# Patient Record
Sex: Female | Born: 1950 | Race: White | Hispanic: No | Marital: Married | Smoking: Never smoker
Health system: Southern US, Community
[De-identification: ages and names within clinical notes are randomized; demographics above are authoritative.]

---

## 2007-01-25 DIAGNOSIS — E7800 Pure hypercholesterolemia, unspecified: Secondary | ICD-10-CM | POA: Insufficient documentation

## 2008-09-07 DIAGNOSIS — H3530 Unspecified macular degeneration: Secondary | ICD-10-CM | POA: Insufficient documentation

## 2008-11-12 DIAGNOSIS — E559 Vitamin D deficiency, unspecified: Secondary | ICD-10-CM | POA: Insufficient documentation

## 2010-02-19 DIAGNOSIS — M8580 Other specified disorders of bone density and structure, unspecified site: Secondary | ICD-10-CM | POA: Insufficient documentation

## 2011-09-14 DIAGNOSIS — Z0000 Encounter for general adult medical examination without abnormal findings: Secondary | ICD-10-CM | POA: Insufficient documentation

## 2012-06-28 DIAGNOSIS — M778 Other enthesopathies, not elsewhere classified: Secondary | ICD-10-CM | POA: Insufficient documentation

## 2012-06-28 DIAGNOSIS — L859 Epidermal thickening, unspecified: Secondary | ICD-10-CM | POA: Insufficient documentation

## 2012-06-28 DIAGNOSIS — B351 Tinea unguium: Secondary | ICD-10-CM | POA: Insufficient documentation

## 2013-01-31 DIAGNOSIS — Z87442 Personal history of urinary calculi: Secondary | ICD-10-CM | POA: Insufficient documentation

## 2013-10-31 DIAGNOSIS — M79671 Pain in right foot: Secondary | ICD-10-CM | POA: Insufficient documentation

## 2013-12-26 DIAGNOSIS — IMO0002 Reserved for concepts with insufficient information to code with codable children: Secondary | ICD-10-CM | POA: Insufficient documentation

## 2015-06-07 ENCOUNTER — Encounter: Payer: Self-pay | Admitting: Podiatry

## 2015-06-07 ENCOUNTER — Ambulatory Visit (INDEPENDENT_AMBULATORY_CARE_PROVIDER_SITE_OTHER): Payer: Managed Care, Other (non HMO) | Admitting: Podiatry

## 2015-06-07 DIAGNOSIS — E119 Type 2 diabetes mellitus without complications: Secondary | ICD-10-CM | POA: Diagnosis not present

## 2015-06-07 DIAGNOSIS — M79676 Pain in unspecified toe(s): Secondary | ICD-10-CM | POA: Diagnosis not present

## 2015-06-07 DIAGNOSIS — B351 Tinea unguium: Secondary | ICD-10-CM | POA: Diagnosis not present

## 2015-06-07 NOTE — Progress Notes (Signed)
Patient ID: Isabel Weeks, female   DOB: 19-Dec-1950, 65 y.o.   MRN: [id number] Complaint:  Visit Type: Patient presents  to my office for continued preventative foot care services. Complaint: Patient states" my nails have grown long and thick and become painful to walk and wear shoes" Patient has been diagnosed with DM with no foot complications. The patient presents for preventative foot care services.   Podiatric Exam: Vascular: dorsalis pedis and posterior tibial pulses are palpable bilateral. Capillary return is immediate. Temperature gradient is WNL. Skin turgor WNL  Sensorium: Normal Semmes Weinstein monofilament test. Normal tactile sensation bilaterally. Nail Exam: Pt has thick disfigured discolored nails with subungual debris noted bilateral entire nail hallux  Ulcer Exam: There is no evidence of ulcer or pre-ulcerative changes or infection. Orthopedic Exam: Muscle tone and strength are WNL. No limitations in general ROM. No crepitus or effusions noted. Foot type and digits show no abnormalities. Bony prominences are unremarkable. 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 Skin: No Porokeratosis. No infection or ulcers  Diagnosis:  Onychomycosis, , Pain in right toe, pain in left toes  Treatment & Plan Procedures and Treatment: Consent by patient was obtained for treatment procedures. The patient understood the discussion of treatment and procedures well. All questions were answered thoroughly reviewed. Debridement of mycotic and hypertrophic toenails, 1 through 5 bilateral and clearing of subungual debris. No ulceration, no infection noted.  Return Visit-Office Procedure: Patient instructed to return to the office for a follow up visit 3 months for continued evaluation and treatment.    Gardiner Barefoot DPM

## 2015-09-06 ENCOUNTER — Encounter: Payer: Self-pay | Admitting: Podiatry

## 2015-09-06 ENCOUNTER — Ambulatory Visit (INDEPENDENT_AMBULATORY_CARE_PROVIDER_SITE_OTHER): Payer: Managed Care, Other (non HMO) | Admitting: Podiatry

## 2015-09-06 DIAGNOSIS — B351 Tinea unguium: Secondary | ICD-10-CM

## 2015-09-06 DIAGNOSIS — M79676 Pain in unspecified toe(s): Secondary | ICD-10-CM | POA: Diagnosis not present

## 2015-09-06 DIAGNOSIS — E119 Type 2 diabetes mellitus without complications: Secondary | ICD-10-CM | POA: Diagnosis not present

## 2015-09-06 NOTE — Progress Notes (Signed)
Patient ID: Isabel Weeks, female   DOB: 08/19/1950, 65 y.o.   MRN: [id number] Complaint:  Visit Type: Patient presents  to my office for continued preventative foot care services. Complaint: Patient states" my nails have grown long and thick and become painful to walk and wear shoes" Patient has been diagnosed with DM with no foot complications. The patient presents for preventative foot care services.   Podiatric Exam: Vascular: dorsalis pedis and posterior tibial pulses are palpable bilateral. Capillary return is immediate. Temperature gradient is WNL. Skin turgor WNL  Sensorium: Normal Semmes Weinstein monofilament test. Normal tactile sensation bilaterally. Nail Exam: Pt has thick disfigured discolored nails with subungual debris noted bilateral entire nail hallux  Ulcer Exam: There is no evidence of ulcer or pre-ulcerative changes or infection. Orthopedic Exam: Muscle tone and strength are WNL. No limitations in general ROM. No crepitus or effusions noted. Foot type and digits show no abnormalities. Bony prominences are unremarkable. 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 Skin: No Porokeratosis. No infection or ulcers  Diagnosis:  Onychomycosis, , Pain in right toe, pain in left toes  Treatment & Plan Procedures and Treatment: Consent by patient was obtained for treatment procedures. The patient understood the discussion of treatment and procedures well. All questions were answered thoroughly reviewed. Debridement of mycotic and hypertrophic toenails, 1 through 5 bilateral and clearing of subungual debris. No ulceration, no infection noted.  Return Visit-Office Procedure: Patient instructed to return to the office for a follow up visit 3 months for continued evaluation and treatment.    Karmon Andis DPM 

## 2015-09-13 ENCOUNTER — Encounter: Payer: Self-pay | Admitting: Podiatry

## 2015-09-13 ENCOUNTER — Ambulatory Visit (INDEPENDENT_AMBULATORY_CARE_PROVIDER_SITE_OTHER): Payer: Managed Care, Other (non HMO) | Admitting: Podiatry

## 2015-09-13 DIAGNOSIS — M79676 Pain in unspecified toe(s): Secondary | ICD-10-CM

## 2015-09-13 DIAGNOSIS — B351 Tinea unguium: Secondary | ICD-10-CM

## 2015-09-13 NOTE — Progress Notes (Signed)
Subjective:     Patient ID: Isabel Weeks, female   DOB: 10-25-1950, 65 y.o.   MRN: [id number]  HPI patient presents concerned because she had a small amount of tissue on the lateral side of the left hallux where the nail border was treated   Review of Systems     Objective:   Physical Exam Neurovascular status intact with a small amount of keratotic tissue on the distal aspect of the left hallux    Assessment:     Thickened tissue from previous ingrown toenail    Plan:     Debrided the tissue applied sterile dressing and instructed on Epson salt soaks. If any redness or drainage were to occur let us know immediately

## 2015-10-25 ENCOUNTER — Other Ambulatory Visit: Payer: Self-pay | Admitting: Family Medicine

## 2015-10-25 DIAGNOSIS — Z1231 Encounter for screening mammogram for malignant neoplasm of breast: Secondary | ICD-10-CM

## 2015-10-25 DIAGNOSIS — E2839 Other primary ovarian failure: Secondary | ICD-10-CM

## 2015-11-12 ENCOUNTER — Ambulatory Visit
Admission: RE | Admit: 2015-11-12 | Discharge: 2015-11-12 | Disposition: A | Discharge status: Home / Self Care | Payer: Managed Care, Other (non HMO) | Source: Ambulatory Visit | Attending: Family Medicine | Admitting: Family Medicine

## 2015-11-12 DIAGNOSIS — Z1231 Encounter for screening mammogram for malignant neoplasm of breast: Secondary | ICD-10-CM

## 2015-11-12 DIAGNOSIS — E2839 Other primary ovarian failure: Secondary | ICD-10-CM

## 2015-11-19 ENCOUNTER — Other Ambulatory Visit: Payer: Self-pay | Admitting: Family Medicine

## 2015-11-19 DIAGNOSIS — R928 Other abnormal and inconclusive findings on diagnostic imaging of breast: Secondary | ICD-10-CM

## 2015-11-27 ENCOUNTER — Ambulatory Visit
Admission: RE | Admit: 2015-11-27 | Discharge: 2015-11-27 | Disposition: A | Discharge status: Home / Self Care | Payer: Managed Care, Other (non HMO) | Source: Ambulatory Visit | Attending: Family Medicine | Admitting: Family Medicine

## 2015-11-27 DIAGNOSIS — R928 Other abnormal and inconclusive findings on diagnostic imaging of breast: Secondary | ICD-10-CM

## 2015-12-06 ENCOUNTER — Encounter: Payer: Self-pay | Admitting: Podiatry

## 2015-12-06 ENCOUNTER — Ambulatory Visit (INDEPENDENT_AMBULATORY_CARE_PROVIDER_SITE_OTHER): Payer: Managed Care, Other (non HMO) | Admitting: Podiatry

## 2015-12-06 DIAGNOSIS — B351 Tinea unguium: Secondary | ICD-10-CM | POA: Diagnosis not present

## 2015-12-06 DIAGNOSIS — M79676 Pain in unspecified toe(s): Secondary | ICD-10-CM | POA: Diagnosis not present

## 2015-12-06 DIAGNOSIS — E119 Type 2 diabetes mellitus without complications: Secondary | ICD-10-CM

## 2015-12-06 NOTE — Progress Notes (Signed)
Patient ID: Isabel Weeks, female   DOB: 11/05/1950, 65 y.o.   MRN: [id number] Complaint:  Visit Type: Patient presents  to my office for continued preventative foot care services. Complaint: Patient states" my nails have grown long and thick and become painful to walk and wear shoes" Patient has been diagnosed with DM with no foot complications. The patient presents for preventative foot care services.   Podiatric Exam: Vascular: dorsalis pedis and posterior tibial pulses are palpable bilateral. Capillary return is immediate. Temperature gradient is WNL. Skin turgor WNL  Sensorium: Normal Semmes Weinstein monofilament test. Normal tactile sensation bilaterally. Nail Exam: Pt has thick disfigured discolored nails with subungual debris noted bilateral entire nail hallux  Ulcer Exam: There is no evidence of ulcer or pre-ulcerative changes or infection. Orthopedic Exam: Muscle tone and strength are WNL. No limitations in general ROM. No crepitus or effusions noted. Foot type and digits show no abnormalities. Bony prominences are unremarkable. 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 Skin: No Porokeratosis. No infection or ulcers  Diagnosis:  Onychomycosis, , Pain in right toe, pain in left toes  Treatment & Plan Procedures and Treatment: Consent by patient was obtained for treatment procedures. The patient understood the discussion of treatment and procedures well. All questions were answered thoroughly reviewed. Debridement of mycotic and hypertrophic toenails, 1 through 5 bilateral and clearing of subungual debris. No ulceration, no infection noted.  Return Visit-Office Procedure: Patient instructed to return to the office for a follow up visit 3 months for continued evaluation and treatment.    Gardiner Barefoot DPM

## 2016-02-12 ENCOUNTER — Other Ambulatory Visit: Payer: Self-pay

## 2016-02-12 ENCOUNTER — Ambulatory Visit (HOSPITAL_COMMUNITY): Payer: Managed Care, Other (non HMO) | Attending: Cardiovascular Disease

## 2016-02-12 ENCOUNTER — Other Ambulatory Visit: Payer: Self-pay | Admitting: Family Medicine

## 2016-02-12 DIAGNOSIS — I5030 Unspecified diastolic (congestive) heart failure: Secondary | ICD-10-CM | POA: Insufficient documentation

## 2016-02-12 DIAGNOSIS — R011 Cardiac murmur, unspecified: Secondary | ICD-10-CM | POA: Insufficient documentation

## 2016-03-06 ENCOUNTER — Ambulatory Visit (INDEPENDENT_AMBULATORY_CARE_PROVIDER_SITE_OTHER): Payer: Managed Care, Other (non HMO) | Admitting: Podiatry

## 2016-03-06 ENCOUNTER — Encounter: Payer: Self-pay | Admitting: Podiatry

## 2016-03-06 VITALS — Ht <= 58 in | Wt 152.0 lb

## 2016-03-06 DIAGNOSIS — B351 Tinea unguium: Secondary | ICD-10-CM | POA: Diagnosis not present

## 2016-03-06 DIAGNOSIS — M79676 Pain in unspecified toe(s): Secondary | ICD-10-CM

## 2016-03-06 DIAGNOSIS — E119 Type 2 diabetes mellitus without complications: Secondary | ICD-10-CM

## 2016-03-06 NOTE — Progress Notes (Signed)
Patient ID: Isabel Weeks, female   DOB: 02-19-1951, 65 y.o.   MRN: [id number] Complaint:  Visit Type: Patient presents  to my office for continued preventative foot care services. Complaint: Patient states" my nails have grown long and thick and become painful to walk and wear shoes" Patient has been diagnosed with DM with no foot complications. The patient presents for preventative foot care services.   Podiatric Exam: Vascular: dorsalis pedis and posterior tibial pulses are palpable bilateral. Capillary return is immediate. Temperature gradient is WNL. Skin turgor WNL  Sensorium: Normal Semmes Weinstein monofilament test. Normal tactile sensation bilaterally. Nail Exam: Pt has thick disfigured discolored nails with subungual debris noted bilateral entire nail hallux  Ulcer Exam: There is no evidence of ulcer or pre-ulcerative changes or infection. Orthopedic Exam: Muscle tone and strength are WNL. No limitations in general ROM. No crepitus or effusions noted. Foot type and digits show no abnormalities. Bony prominences are unremarkable. 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 Skin: No Porokeratosis. No infection or ulcers  Diagnosis:  Onychomycosis, , Pain in right toe, pain in left toes  Treatment & Plan Procedures and Treatment: Consent by patient was obtained for treatment procedures. The patient understood the discussion of treatment and procedures well. All questions were answered thoroughly reviewed. Debridement of mycotic and hypertrophic toenails, 1 through 5 bilateral and clearing of subungual debris. No ulceration, no infection noted.  Return Visit-Office Procedure: Patient instructed to return to the office for a follow up visit 3 months for continued evaluation and treatment.    Gardiner Barefoot DPM

## 2016-05-06 ENCOUNTER — Other Ambulatory Visit: Payer: Self-pay | Admitting: Family Medicine

## 2016-05-06 DIAGNOSIS — R921 Mammographic calcification found on diagnostic imaging of breast: Secondary | ICD-10-CM

## 2016-05-19 ENCOUNTER — Ambulatory Visit
Admission: RE | Admit: 2016-05-19 | Discharge: 2016-05-19 | Disposition: A | Discharge status: Home / Self Care | Payer: Managed Care, Other (non HMO) | Source: Ambulatory Visit | Attending: Family Medicine | Admitting: Family Medicine

## 2016-05-19 DIAGNOSIS — R921 Mammographic calcification found on diagnostic imaging of breast: Secondary | ICD-10-CM

## 2016-06-05 ENCOUNTER — Ambulatory Visit: Payer: Managed Care, Other (non HMO) | Admitting: Podiatry

## 2016-06-12 ENCOUNTER — Ambulatory Visit (INDEPENDENT_AMBULATORY_CARE_PROVIDER_SITE_OTHER): Payer: Managed Care, Other (non HMO) | Admitting: Podiatry

## 2016-06-12 DIAGNOSIS — M79676 Pain in unspecified toe(s): Secondary | ICD-10-CM

## 2016-06-12 DIAGNOSIS — E119 Type 2 diabetes mellitus without complications: Secondary | ICD-10-CM

## 2016-06-12 DIAGNOSIS — B351 Tinea unguium: Secondary | ICD-10-CM | POA: Diagnosis not present

## 2016-06-12 NOTE — Progress Notes (Signed)
Patient ID: Isabel Weeks, female   DOB: 10/18/1950, 66 y.o.   MRN: [id number] Complaint:  Visit Type: Patient presents  to my office for continued preventative foot care services. Complaint: Patient states" my nails have grown long and thick and become painful to walk and wear shoes" Patient has been diagnosed with DM with no foot complications. The patient presents for preventative foot care services.   Podiatric Exam: Vascular: dorsalis pedis and posterior tibial pulses are palpable bilateral. Capillary return is immediate. Temperature gradient is WNL. Skin turgor WNL  Sensorium: Normal Semmes Weinstein monofilament test. Normal tactile sensation bilaterally. Nail Exam: Pt has thick disfigured discolored nails with subungual debris noted bilateral entire nail hallux  Ulcer Exam: There is no evidence of ulcer or pre-ulcerative changes or infection. Orthopedic Exam: Muscle tone and strength are WNL. No limitations in general ROM. No crepitus or effusions noted. Foot type and digits show no abnormalities. Bony prominences are unremarkable. 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 Skin: No Porokeratosis. No infection or ulcers  Diagnosis:  Onychomycosis, , Pain in right toe, pain in left toes  Treatment & Plan Procedures and Treatment: Consent by patient was obtained for treatment procedures. The patient understood the discussion of treatment and procedures well. All questions were answered thoroughly reviewed. Debridement of mycotic and hypertrophic toenails, 1 through 5 bilateral and clearing of subungual debris. No ulceration, no infection noted.  Return Visit-Office Procedure: Patient instructed to return to the office for a follow up visit 3 months for continued evaluation and treatment.    Gardiner Barefoot DPM

## 2016-09-11 ENCOUNTER — Encounter: Payer: Self-pay | Admitting: Podiatry

## 2016-09-11 ENCOUNTER — Ambulatory Visit (INDEPENDENT_AMBULATORY_CARE_PROVIDER_SITE_OTHER): Payer: 59 | Admitting: Podiatry

## 2016-09-11 DIAGNOSIS — M79676 Pain in unspecified toe(s): Secondary | ICD-10-CM

## 2016-09-11 DIAGNOSIS — E119 Type 2 diabetes mellitus without complications: Secondary | ICD-10-CM

## 2016-09-11 DIAGNOSIS — B351 Tinea unguium: Secondary | ICD-10-CM | POA: Diagnosis not present

## 2016-09-11 NOTE — Progress Notes (Signed)
Patient ID: Isabel Weeks, female   DOB: 1950/10/10, 66 y.o.   MRN: [id number] Complaint:  Visit Type: Patient presents  to my office for continued preventative foot care services. Complaint: Patient states" my nails have grown long and thick and become painful to walk and wear shoes" Patient has been diagnosed with DM with no foot complications. The patient presents for preventative foot care services.   Podiatric Exam: Vascular: dorsalis pedis and posterior tibial pulses are palpable bilateral. Capillary return is immediate. Temperature gradient is WNL. Skin turgor WNL  Sensorium: Normal Semmes Weinstein monofilament test. Normal tactile sensation bilaterally. Nail Exam: Pt has thick disfigured discolored nails with subungual debris noted bilateral entire nail hallux  Ulcer Exam: There is no evidence of ulcer or pre-ulcerative changes or infection. Orthopedic Exam: Muscle tone and strength are WNL. No limitations in general ROM. No crepitus or effusions noted. Foot type and digits show no abnormalities. Bony prominences are unremarkable. 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 Skin: No Porokeratosis. No infection or ulcers  Diagnosis:  Onychomycosis, , Pain in right toe, pain in left toes  Treatment & Plan Procedures and Treatment: Consent by patient was obtained for treatment procedures. The patient understood the discussion of treatment and procedures well. All questions were answered thoroughly reviewed. Debridement of mycotic and hypertrophic toenails, 1 through 5 bilateral and clearing of subungual debris. No ulceration, no infection noted.  Patient has pain that developed last week along the inside border right hallux. Return Visit-Office Procedure: Patient instructed to return to the office for a follow up visit 3 months for continued evaluation and treatment.    Gardiner Barefoot DPM

## 2016-12-02 ENCOUNTER — Ambulatory Visit (INDEPENDENT_AMBULATORY_CARE_PROVIDER_SITE_OTHER): Payer: 59 | Admitting: Podiatry

## 2016-12-02 DIAGNOSIS — B351 Tinea unguium: Secondary | ICD-10-CM | POA: Diagnosis not present

## 2016-12-02 DIAGNOSIS — M79676 Pain in unspecified toe(s): Secondary | ICD-10-CM

## 2016-12-02 DIAGNOSIS — E119 Type 2 diabetes mellitus without complications: Secondary | ICD-10-CM

## 2016-12-02 NOTE — Progress Notes (Signed)
Patient ID: Isabel Weeks, female   DOB: 12/24/1950, 66 y.o.   MRN: [id number] Complaint:  Visit Type: Patient presents  to my office for continued preventative foot care services. Complaint: Patient states" my nails have grown long and thick and become painful to walk and wear shoes" Patient has been diagnosed with DM with no foot complications. The patient presents for preventative foot care services.   Podiatric Exam: Vascular: dorsalis pedis and posterior tibial pulses are palpable bilateral. Capillary return is immediate. Temperature gradient is WNL. Skin turgor WNL  Sensorium: Normal Semmes Weinstein monofilament test. Normal tactile sensation bilaterally. Nail Exam: Pt has thick disfigured discolored nails with subungual debris noted bilateral entire nail hallux  Ulcer Exam: There is no evidence of ulcer or pre-ulcerative changes or infection. Orthopedic Exam: Muscle tone and strength are WNL. No limitations in general ROM. No crepitus or effusions noted. Foot type and digits show no abnormalities. Bony prominences are unremarkable. 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 Skin: No Porokeratosis. No infection or ulcers  Diagnosis:  Onychomycosis, , Pain in right toe, pain in left toes  Treatment & Plan Procedures and Treatment: Consent by patient was obtained for treatment procedures. The patient understood the discussion of treatment and procedures well. All questions were answered thoroughly reviewed. Debridement of mycotic and hypertrophic toenails, 1 through 5 bilateral and clearing of subungual debris. No ulceration, no infection noted.  Return Visit-Office Procedure: Patient instructed to return to the office for a follow up visit 3 months for continued evaluation and treatment.    Kelcey Korus DPM 

## 2016-12-04 ENCOUNTER — Ambulatory Visit: Payer: 59 | Admitting: Podiatry

## 2016-12-21 ENCOUNTER — Other Ambulatory Visit: Payer: Self-pay | Admitting: Family Medicine

## 2016-12-21 DIAGNOSIS — Z1231 Encounter for screening mammogram for malignant neoplasm of breast: Secondary | ICD-10-CM

## 2016-12-28 ENCOUNTER — Ambulatory Visit
Admission: RE | Admit: 2016-12-28 | Discharge: 2016-12-28 | Disposition: A | Discharge status: Home / Self Care | Payer: 59 | Source: Ambulatory Visit | Attending: Family Medicine | Admitting: Family Medicine

## 2016-12-28 DIAGNOSIS — Z1231 Encounter for screening mammogram for malignant neoplasm of breast: Secondary | ICD-10-CM

## 2017-03-10 ENCOUNTER — Encounter: Payer: Self-pay | Admitting: Podiatry

## 2017-03-10 ENCOUNTER — Ambulatory Visit (INDEPENDENT_AMBULATORY_CARE_PROVIDER_SITE_OTHER): Payer: 59 | Admitting: Podiatry

## 2017-03-10 DIAGNOSIS — E119 Type 2 diabetes mellitus without complications: Secondary | ICD-10-CM | POA: Diagnosis not present

## 2017-03-10 DIAGNOSIS — B351 Tinea unguium: Secondary | ICD-10-CM

## 2017-03-10 DIAGNOSIS — M79676 Pain in unspecified toe(s): Secondary | ICD-10-CM

## 2017-03-10 NOTE — Progress Notes (Signed)
Patient ID: Isabel Weeks, female   DOB: [birth year], 66 y.o.   MRN: [id number] Complaint:  Visit Type: Patient presents  to my office for continued preventative foot care services. Complaint: Patient states" my nails have grown long and thick and become painful to walk and wear shoes" Patient has been diagnosed with DM with no foot complications. The patient presents for preventative foot care services.   Podiatric Exam: Vascular: dorsalis pedis and posterior tibial pulses are palpable bilateral. Capillary return is immediate. Temperature gradient is WNL. Skin turgor WNL  Sensorium: Normal Semmes Weinstein monofilament test. Normal tactile sensation bilaterally. Nail Exam: Pt has thick disfigured discolored nails with subungual debris noted bilateral entire nail hallux  Ulcer Exam: There is no evidence of ulcer or pre-ulcerative changes or infection. Orthopedic Exam: Muscle tone and strength are WNL. No limitations in general ROM. No crepitus or effusions noted. Foot type and digits show no abnormalities. Bony prominences are unremarkable. 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 Skin: No Porokeratosis. No infection or ulcers  Diagnosis:  Onychomycosis, , Pain in right toe, pain in left toes  Treatment & Plan Procedures and Treatment: Consent by patient was obtained for treatment procedures. The patient understood the discussion of treatment and procedures well. All questions were answered thoroughly reviewed. Debridement of mycotic and hypertrophic toenails, 1 through 5 bilateral and clearing of subungual debris. No ulceration, no infection noted.  Return Visit-Office Procedure: Patient instructed to return to the office for a follow up visit 3 months for continued evaluation and treatment.    Gardiner Barefoot DPM

## 2017-06-16 ENCOUNTER — Ambulatory Visit (INDEPENDENT_AMBULATORY_CARE_PROVIDER_SITE_OTHER): Payer: 59 | Admitting: Podiatry

## 2017-06-16 ENCOUNTER — Encounter: Payer: Self-pay | Admitting: Podiatry

## 2017-06-16 DIAGNOSIS — M79676 Pain in unspecified toe(s): Secondary | ICD-10-CM

## 2017-06-16 DIAGNOSIS — E119 Type 2 diabetes mellitus without complications: Secondary | ICD-10-CM

## 2017-06-16 DIAGNOSIS — B351 Tinea unguium: Secondary | ICD-10-CM

## 2017-06-16 NOTE — Progress Notes (Signed)
Patient ID: Isabel Weeks, female   DOB: 1950/06/06, 67 y.o.   MRN: [id number] Complaint:  Visit Type: Patient presents  to my office for continued preventative foot care services. Complaint: Patient states" my nails have grown long and thick and become painful to walk and wear shoes" Patient has been diagnosed with DM with no foot complications. The patient presents for preventative foot care services. Patient says she is starting to take lisinopril.  Podiatric Exam: Vascular: dorsalis pedis and posterior tibial pulses are palpable bilateral. Capillary return is immediate. Temperature gradient is WNL. Skin turgor WNL  Sensorium: Normal Semmes Weinstein monofilament test. Normal tactile sensation bilaterally. Nail Exam: Pt has thick disfigured discolored nails with subungual debris noted bilateral entire nail hallux  Ulcer Exam: There is no evidence of ulcer or pre-ulcerative changes or infection. Orthopedic Exam: Muscle tone and strength are WNL. No limitations in general ROM. No crepitus or effusions noted. Foot type and digits show no abnormalities. Bony prominences are unremarkable. 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 Skin: No Porokeratosis. No infection or ulcers  Diagnosis:  Onychomycosis, , Pain in right toe, pain in left toes  Treatment & Plan Procedures and Treatment: Consent by patient was obtained for treatment procedures. The patient understood the discussion of treatment and procedures well. All questions were answered thoroughly reviewed. Debridement of mycotic and hypertrophic toenails, 1 through 5 bilateral and clearing of subungual debris. No ulceration, no infection noted.  Return Visit-Office Procedure: Patient instructed to return to the office for a follow up visit 3 months for continued evaluation and treatment.    Gardiner Barefoot DPM

## 2017-07-13 ENCOUNTER — Other Ambulatory Visit: Payer: Self-pay | Admitting: Family Medicine

## 2017-07-13 DIAGNOSIS — Q159 Congenital malformation of eye, unspecified: Secondary | ICD-10-CM

## 2017-08-02 ENCOUNTER — Ambulatory Visit
Admission: RE | Admit: 2017-08-02 | Discharge: 2017-08-02 | Disposition: A | Discharge status: Home / Self Care | Payer: 59 | Source: Ambulatory Visit | Attending: Family Medicine | Admitting: Family Medicine

## 2017-08-02 DIAGNOSIS — Q159 Congenital malformation of eye, unspecified: Secondary | ICD-10-CM

## 2017-09-15 ENCOUNTER — Encounter: Payer: Self-pay | Admitting: Podiatry

## 2017-09-15 ENCOUNTER — Ambulatory Visit (INDEPENDENT_AMBULATORY_CARE_PROVIDER_SITE_OTHER): Payer: 59 | Admitting: Podiatry

## 2017-09-15 DIAGNOSIS — B351 Tinea unguium: Secondary | ICD-10-CM | POA: Diagnosis not present

## 2017-09-15 DIAGNOSIS — M79676 Pain in unspecified toe(s): Secondary | ICD-10-CM | POA: Diagnosis not present

## 2017-09-15 DIAGNOSIS — E119 Type 2 diabetes mellitus without complications: Secondary | ICD-10-CM

## 2017-09-15 NOTE — Progress Notes (Signed)
Patient ID: Isabel Weeks, female   DOB: 03/02/1951, 67 y.o.   MRN: [id number] Complaint:  Visit Type: Patient presents  to my office for continued preventative foot care services. Complaint: Patient states" my nails have grown long and thick and become painful to walk and wear shoes" Patient has been diagnosed with DM with no foot complications. The patient presents for preventative foot care services. Patient says she is starting to take lisinopril.  Podiatric Exam: Vascular: dorsalis pedis and posterior tibial pulses are palpable bilateral. Capillary return is immediate. Temperature gradient is WNL. Skin turgor WNL  Sensorium: Normal Semmes Weinstein monofilament test. Normal tactile sensation bilaterally. Nail Exam: Pt has thick disfigured discolored nails with subungual debris noted bilateral entire nail hallux  Ulcer Exam: There is no evidence of ulcer or pre-ulcerative changes or infection. Orthopedic Exam: Muscle tone and strength are WNL. No limitations in general ROM. No crepitus or effusions noted. Foot type and digits show no abnormalities. Bony prominences are unremarkable. 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 Skin: No Porokeratosis. No infection or ulcers  Diagnosis:  Onychomycosis, , Pain in right toe, pain in left toes  Treatment & Plan Procedures and Treatment: Consent by patient was obtained for treatment procedures. The patient understood the discussion of treatment and procedures well. All questions were answered thoroughly reviewed. Debridement of mycotic and hypertrophic toenails, 1 through 5 bilateral and clearing of subungual debris. No ulceration, no infection noted.  Return Visit-Office Procedure: Patient instructed to return to the office for a follow up visit 3 months for continued evaluation and treatment.    Shawnn Bouillon DPM 

## 2017-11-09 DIAGNOSIS — E1169 Type 2 diabetes mellitus with other specified complication: Secondary | ICD-10-CM | POA: Diagnosis not present

## 2017-11-09 DIAGNOSIS — Z0000 Encounter for general adult medical examination without abnormal findings: Secondary | ICD-10-CM | POA: Diagnosis not present

## 2017-11-09 DIAGNOSIS — E785 Hyperlipidemia, unspecified: Secondary | ICD-10-CM | POA: Diagnosis not present

## 2017-11-30 DIAGNOSIS — H35423 Microcystoid degeneration of retina, bilateral: Secondary | ICD-10-CM | POA: Diagnosis not present

## 2017-11-30 DIAGNOSIS — E113291 Type 2 diabetes mellitus with mild nonproliferative diabetic retinopathy without macular edema, right eye: Secondary | ICD-10-CM | POA: Diagnosis not present

## 2017-11-30 DIAGNOSIS — H43813 Vitreous degeneration, bilateral: Secondary | ICD-10-CM | POA: Diagnosis not present

## 2017-11-30 DIAGNOSIS — H353134 Nonexudative age-related macular degeneration, bilateral, advanced atrophic with subfoveal involvement: Secondary | ICD-10-CM | POA: Diagnosis not present

## 2017-12-01 DIAGNOSIS — I10 Essential (primary) hypertension: Secondary | ICD-10-CM | POA: Diagnosis not present

## 2017-12-07 DIAGNOSIS — E113293 Type 2 diabetes mellitus with mild nonproliferative diabetic retinopathy without macular edema, bilateral: Secondary | ICD-10-CM | POA: Diagnosis not present

## 2017-12-07 DIAGNOSIS — Z961 Presence of intraocular lens: Secondary | ICD-10-CM | POA: Diagnosis not present

## 2017-12-07 DIAGNOSIS — H2512 Age-related nuclear cataract, left eye: Secondary | ICD-10-CM | POA: Diagnosis not present

## 2017-12-07 DIAGNOSIS — H353134 Nonexudative age-related macular degeneration, bilateral, advanced atrophic with subfoveal involvement: Secondary | ICD-10-CM | POA: Diagnosis not present

## 2017-12-07 DIAGNOSIS — H26491 Other secondary cataract, right eye: Secondary | ICD-10-CM | POA: Diagnosis not present

## 2017-12-15 ENCOUNTER — Encounter: Payer: Self-pay | Admitting: Podiatry

## 2017-12-15 ENCOUNTER — Encounter: Payer: Self-pay | Admitting: *Deleted

## 2017-12-15 ENCOUNTER — Ambulatory Visit: Payer: Medicare HMO | Admitting: Podiatry

## 2017-12-15 DIAGNOSIS — M79676 Pain in unspecified toe(s): Secondary | ICD-10-CM

## 2017-12-15 DIAGNOSIS — B351 Tinea unguium: Secondary | ICD-10-CM | POA: Diagnosis not present

## 2017-12-15 DIAGNOSIS — E119 Type 2 diabetes mellitus without complications: Secondary | ICD-10-CM

## 2017-12-15 NOTE — Progress Notes (Signed)
Patient ID: Isabel Weeks, female   DOB: 12/31/1950, 67 y.o.   MRN: [id number] Complaint:  Visit Type: Patient presents  to my office for continued preventative foot care services. Complaint: Patient states" my nails have grown long and thick and become painful to walk and wear shoes" Patient has been diagnosed with DM with no foot complications. The patient presents for preventative foot care services. Patient says she is starting to take lisinopril.  Podiatric Exam: Vascular: dorsalis pedis and posterior tibial pulses are palpable bilateral. Capillary return is immediate. Temperature gradient is WNL. Skin turgor WNL  Sensorium: Normal Semmes Weinstein monofilament test. Normal tactile sensation bilaterally. Nail Exam: Pt has thick disfigured discolored nails with subungual debris noted bilateral entire nail hallux  Ulcer Exam: There is no evidence of ulcer or pre-ulcerative changes or infection. Orthopedic Exam: Muscle tone and strength are WNL. No limitations in general ROM. No crepitus or effusions noted. Foot type and digits show no abnormalities. Bony prominences are unremarkable. 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 Skin: No Porokeratosis. No infection or ulcers  Diagnosis:  Onychomycosis, , Pain in right toe, pain in left toes  Treatment & Plan Procedures and Treatment: Consent by patient was obtained for treatment procedures. The patient understood the discussion of treatment and procedures well. All questions were answered thoroughly reviewed. Debridement of mycotic and hypertrophic toenails, 1 through 5 bilateral and clearing of subungual debris. No ulceration, no infection noted.  Return Visit-Office Procedure: Patient instructed to return to the office for a follow up visit 3 months for continued evaluation and treatment.    Viraaj Vorndran DPM 

## 2017-12-28 ENCOUNTER — Other Ambulatory Visit: Payer: Self-pay | Admitting: Family Medicine

## 2017-12-28 DIAGNOSIS — Z1231 Encounter for screening mammogram for malignant neoplasm of breast: Secondary | ICD-10-CM

## 2017-12-30 DIAGNOSIS — D225 Melanocytic nevi of trunk: Secondary | ICD-10-CM | POA: Diagnosis not present

## 2017-12-30 DIAGNOSIS — D2262 Melanocytic nevi of left upper limb, including shoulder: Secondary | ICD-10-CM | POA: Diagnosis not present

## 2017-12-30 DIAGNOSIS — D2239 Melanocytic nevi of other parts of face: Secondary | ICD-10-CM | POA: Diagnosis not present

## 2017-12-30 DIAGNOSIS — C44612 Basal cell carcinoma of skin of right upper limb, including shoulder: Secondary | ICD-10-CM | POA: Diagnosis not present

## 2017-12-30 DIAGNOSIS — D2271 Melanocytic nevi of right lower limb, including hip: Secondary | ICD-10-CM | POA: Diagnosis not present

## 2017-12-30 DIAGNOSIS — L821 Other seborrheic keratosis: Secondary | ICD-10-CM | POA: Diagnosis not present

## 2017-12-30 DIAGNOSIS — D2272 Melanocytic nevi of left lower limb, including hip: Secondary | ICD-10-CM | POA: Diagnosis not present

## 2017-12-30 DIAGNOSIS — L570 Actinic keratosis: Secondary | ICD-10-CM | POA: Diagnosis not present

## 2018-01-19 DIAGNOSIS — Z91038 Other insect allergy status: Secondary | ICD-10-CM | POA: Diagnosis not present

## 2018-01-19 DIAGNOSIS — Z7984 Long term (current) use of oral hypoglycemic drugs: Secondary | ICD-10-CM | POA: Diagnosis not present

## 2018-01-19 DIAGNOSIS — E785 Hyperlipidemia, unspecified: Secondary | ICD-10-CM | POA: Diagnosis not present

## 2018-01-19 DIAGNOSIS — E1165 Type 2 diabetes mellitus with hyperglycemia: Secondary | ICD-10-CM | POA: Diagnosis not present

## 2018-01-19 DIAGNOSIS — E669 Obesity, unspecified: Secondary | ICD-10-CM | POA: Diagnosis not present

## 2018-01-19 DIAGNOSIS — Z823 Family history of stroke: Secondary | ICD-10-CM | POA: Diagnosis not present

## 2018-01-19 DIAGNOSIS — I10 Essential (primary) hypertension: Secondary | ICD-10-CM | POA: Diagnosis not present

## 2018-01-19 DIAGNOSIS — G3184 Mild cognitive impairment, so stated: Secondary | ICD-10-CM | POA: Diagnosis not present

## 2018-01-19 DIAGNOSIS — Z882 Allergy status to sulfonamides status: Secondary | ICD-10-CM | POA: Diagnosis not present

## 2018-01-19 DIAGNOSIS — Z8249 Family history of ischemic heart disease and other diseases of the circulatory system: Secondary | ICD-10-CM | POA: Diagnosis not present

## 2018-01-20 ENCOUNTER — Ambulatory Visit
Admission: RE | Admit: 2018-01-20 | Discharge: 2018-01-20 | Disposition: A | Discharge status: Home / Self Care | Payer: Medicare HMO | Source: Ambulatory Visit | Attending: Family Medicine | Admitting: Family Medicine

## 2018-01-20 DIAGNOSIS — Z1231 Encounter for screening mammogram for malignant neoplasm of breast: Secondary | ICD-10-CM

## 2018-01-26 DIAGNOSIS — L570 Actinic keratosis: Secondary | ICD-10-CM | POA: Diagnosis not present

## 2018-01-26 DIAGNOSIS — C44612 Basal cell carcinoma of skin of right upper limb, including shoulder: Secondary | ICD-10-CM | POA: Diagnosis not present

## 2018-02-09 DIAGNOSIS — H3530 Unspecified macular degeneration: Secondary | ICD-10-CM | POA: Diagnosis not present

## 2018-02-09 DIAGNOSIS — E785 Hyperlipidemia, unspecified: Secondary | ICD-10-CM | POA: Diagnosis not present

## 2018-02-09 DIAGNOSIS — I10 Essential (primary) hypertension: Secondary | ICD-10-CM | POA: Diagnosis not present

## 2018-02-09 DIAGNOSIS — Z23 Encounter for immunization: Secondary | ICD-10-CM | POA: Diagnosis not present

## 2018-02-09 DIAGNOSIS — E119 Type 2 diabetes mellitus without complications: Secondary | ICD-10-CM | POA: Diagnosis not present

## 2018-03-16 ENCOUNTER — Encounter: Payer: Self-pay | Admitting: Podiatry

## 2018-03-16 ENCOUNTER — Ambulatory Visit: Payer: Medicare HMO | Admitting: Podiatry

## 2018-03-16 DIAGNOSIS — E119 Type 2 diabetes mellitus without complications: Secondary | ICD-10-CM

## 2018-03-16 DIAGNOSIS — B351 Tinea unguium: Secondary | ICD-10-CM | POA: Diagnosis not present

## 2018-03-16 DIAGNOSIS — M79676 Pain in unspecified toe(s): Secondary | ICD-10-CM | POA: Diagnosis not present

## 2018-03-16 NOTE — Progress Notes (Signed)
Patient ID: Isabel Weeks, female   DOB: 09-03-1950, 67 y.o.   MRN: [id number] Complaint:  Visit Type: Patient presents  to my office for continued preventative foot care services. Complaint: Patient states" my nails have grown long and thick and become painful to walk and wear shoes" Patient has been diagnosed with DM with no foot complications. The patient presents for preventative foot care services. Patient says she is starting to take lisinopril.  Podiatric Exam: Vascular: dorsalis pedis and posterior tibial pulses are palpable bilateral. Capillary return is immediate. Temperature gradient is WNL. Skin turgor WNL  Sensorium: Normal Semmes Weinstein monofilament test. Normal tactile sensation bilaterally. Nail Exam: Pt has thick disfigured discolored nails with subungual debris noted bilateral entire nail hallux  Ulcer Exam: There is no evidence of ulcer or pre-ulcerative changes or infection. Orthopedic Exam: Muscle tone and strength are WNL. No limitations in general ROM. No crepitus or effusions noted. Foot type and digits show no abnormalities. Bony prominences are unremarkable. 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 Skin: No Porokeratosis. No infection or ulcers  Diagnosis:  Onychomycosis, , Pain in right toe, pain in left toes  Treatment & Plan Procedures and Treatment: Consent by patient was obtained for treatment procedures. The patient understood the discussion of treatment and procedures well. All questions were answered thoroughly reviewed. Debridement of mycotic and hypertrophic toenails, 1 through 5 bilateral and clearing of subungual debris. No ulceration, no infection noted.  Return Visit-Office Procedure: Patient instructed to return to the office for a follow up visit 3 months for continued evaluation and treatment.    Gardiner Barefoot DPM

## 2018-05-10 DIAGNOSIS — E119 Type 2 diabetes mellitus without complications: Secondary | ICD-10-CM | POA: Diagnosis not present

## 2018-05-10 DIAGNOSIS — E785 Hyperlipidemia, unspecified: Secondary | ICD-10-CM | POA: Diagnosis not present

## 2018-05-10 DIAGNOSIS — Z23 Encounter for immunization: Secondary | ICD-10-CM | POA: Diagnosis not present

## 2018-05-10 DIAGNOSIS — I10 Essential (primary) hypertension: Secondary | ICD-10-CM | POA: Diagnosis not present

## 2018-05-10 DIAGNOSIS — H3530 Unspecified macular degeneration: Secondary | ICD-10-CM | POA: Diagnosis not present

## 2018-05-31 DIAGNOSIS — R69 Illness, unspecified: Secondary | ICD-10-CM | POA: Diagnosis not present

## 2018-06-01 DIAGNOSIS — H353134 Nonexudative age-related macular degeneration, bilateral, advanced atrophic with subfoveal involvement: Secondary | ICD-10-CM | POA: Diagnosis not present

## 2018-06-01 DIAGNOSIS — H35423 Microcystoid degeneration of retina, bilateral: Secondary | ICD-10-CM | POA: Diagnosis not present

## 2018-06-01 DIAGNOSIS — H35372 Puckering of macula, left eye: Secondary | ICD-10-CM | POA: Diagnosis not present

## 2018-06-01 DIAGNOSIS — E113291 Type 2 diabetes mellitus with mild nonproliferative diabetic retinopathy without macular edema, right eye: Secondary | ICD-10-CM | POA: Diagnosis not present

## 2018-06-07 DIAGNOSIS — R69 Illness, unspecified: Secondary | ICD-10-CM | POA: Diagnosis not present

## 2018-06-15 ENCOUNTER — Ambulatory Visit: Payer: Medicare HMO | Admitting: Podiatry

## 2018-06-15 ENCOUNTER — Encounter: Payer: Self-pay | Admitting: Podiatry

## 2018-06-15 DIAGNOSIS — B351 Tinea unguium: Secondary | ICD-10-CM | POA: Diagnosis not present

## 2018-06-15 DIAGNOSIS — E119 Type 2 diabetes mellitus without complications: Secondary | ICD-10-CM

## 2018-06-15 DIAGNOSIS — M79676 Pain in unspecified toe(s): Secondary | ICD-10-CM

## 2018-06-15 NOTE — Progress Notes (Signed)
Patient ID: Isabel Weeks, female   DOB: 30-Nov-1950, 68 y.o.   MRN: [id number] Complaint:  Visit Type: Patient presents  to my office for continued preventative foot care services. Complaint: Patient states" my nails have grown long and thick and become painful to walk and wear shoes" Patient has been diagnosed with DM with no foot complications. The patient presents for preventative foot care services. Patient says she is starting to take lisinopril.  Podiatric Exam: Vascular: dorsalis pedis and posterior tibial pulses are palpable bilateral. Capillary return is immediate. Temperature gradient is WNL. Skin turgor WNL  Sensorium: Normal Semmes Weinstein monofilament test. Normal tactile sensation bilaterally. Nail Exam: Pt has thick disfigured discolored nails with subungual debris noted bilateral entire nail hallux  Ulcer Exam: There is no evidence of ulcer or pre-ulcerative changes or infection. Orthopedic Exam: Muscle tone and strength are WNL. No limitations in general ROM. No crepitus or effusions noted. Foot type and digits show no abnormalities. Bony prominences are unremarkable. 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 Skin: No Porokeratosis. No infection or ulcers  Diagnosis:  Onychomycosis, , Pain in right toe, pain in left toes  Treatment & Plan Procedures and Treatment: Consent by patient was obtained for treatment procedures. The patient understood the discussion of treatment and procedures well. All questions were answered thoroughly reviewed. Debridement of mycotic and hypertrophic toenails, 1 through 5 bilateral and clearing of subungual debris. No ulceration, no infection noted.  Return Visit-Office Procedure: Patient instructed to return to the office for a follow up visit 3 months for continued evaluation and treatment.    Gardiner Barefoot DPM

## 2018-08-10 DIAGNOSIS — E1169 Type 2 diabetes mellitus with other specified complication: Secondary | ICD-10-CM | POA: Diagnosis not present

## 2018-08-10 DIAGNOSIS — I10 Essential (primary) hypertension: Secondary | ICD-10-CM | POA: Diagnosis not present

## 2018-08-10 DIAGNOSIS — H3530 Unspecified macular degeneration: Secondary | ICD-10-CM | POA: Diagnosis not present

## 2018-08-10 DIAGNOSIS — E785 Hyperlipidemia, unspecified: Secondary | ICD-10-CM | POA: Diagnosis not present

## 2018-08-10 DIAGNOSIS — E119 Type 2 diabetes mellitus without complications: Secondary | ICD-10-CM | POA: Diagnosis not present

## 2018-09-14 ENCOUNTER — Encounter: Payer: Self-pay | Admitting: Podiatry

## 2018-09-14 ENCOUNTER — Other Ambulatory Visit: Payer: Self-pay

## 2018-09-14 ENCOUNTER — Ambulatory Visit: Payer: Medicare HMO | Admitting: Podiatry

## 2018-09-14 DIAGNOSIS — E119 Type 2 diabetes mellitus without complications: Secondary | ICD-10-CM

## 2018-09-14 DIAGNOSIS — M79676 Pain in unspecified toe(s): Secondary | ICD-10-CM | POA: Diagnosis not present

## 2018-09-14 DIAGNOSIS — B351 Tinea unguium: Secondary | ICD-10-CM | POA: Diagnosis not present

## 2018-09-14 NOTE — Progress Notes (Signed)
Patient ID: Isabel Weeks, female   DOB: 1950/10/16, 68 y.o.   MRN: [id number] Complaint:  Visit Type: Patient presents  to my office for continued preventative foot care services. Complaint: Patient states" my nails have grown long and thick and become painful to walk and wear shoes" Patient has been diagnosed with DM with no foot complications. The patient presents for preventative foot care services. Patient says she is starting to take lisinopril.  Podiatric Exam: Vascular: dorsalis pedis and posterior tibial pulses are palpable bilateral. Capillary return is immediate. Temperature gradient is WNL. Skin turgor WNL  Sensorium: Normal Semmes Weinstein monofilament test. Normal tactile sensation bilaterally. Nail Exam: Pt has thick disfigured discolored nails with subungual debris noted bilateral entire nail hallux  Ulcer Exam: There is no evidence of ulcer or pre-ulcerative changes or infection. Orthopedic Exam: Muscle tone and strength are WNL. No limitations in general ROM. No crepitus or effusions noted. Foot type and digits show no abnormalities. Bony prominences are unremarkable. 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 Skin: No Porokeratosis. No infection or ulcers  Diagnosis:  Onychomycosis, , Pain in right toe, pain in left toes  Treatment & Plan Procedures and Treatment: Consent by patient was obtained for treatment procedures. The patient understood the discussion of treatment and procedures well. All questions were answered thoroughly reviewed. Debridement of mycotic and hypertrophic toenails, 1 through 5 bilateral and clearing of subungual debris. No ulceration, no infection noted.  Return Visit-Office Procedure: Patient instructed to return to the office for a follow up visit 3 months for continued evaluation and treatment.    Gardiner Barefoot DPM

## 2018-10-17 DIAGNOSIS — H353120 Nonexudative age-related macular degeneration, left eye, stage unspecified: Secondary | ICD-10-CM | POA: Diagnosis not present

## 2018-10-17 DIAGNOSIS — H2512 Age-related nuclear cataract, left eye: Secondary | ICD-10-CM | POA: Diagnosis not present

## 2018-11-10 DIAGNOSIS — H2512 Age-related nuclear cataract, left eye: Secondary | ICD-10-CM | POA: Diagnosis not present

## 2018-11-23 DIAGNOSIS — E113293 Type 2 diabetes mellitus with mild nonproliferative diabetic retinopathy without macular edema, bilateral: Secondary | ICD-10-CM | POA: Diagnosis not present

## 2018-11-23 DIAGNOSIS — Z1211 Encounter for screening for malignant neoplasm of colon: Secondary | ICD-10-CM | POA: Diagnosis not present

## 2018-11-23 DIAGNOSIS — Z7984 Long term (current) use of oral hypoglycemic drugs: Secondary | ICD-10-CM | POA: Diagnosis not present

## 2018-11-23 DIAGNOSIS — I10 Essential (primary) hypertension: Secondary | ICD-10-CM | POA: Diagnosis not present

## 2018-11-23 DIAGNOSIS — E1169 Type 2 diabetes mellitus with other specified complication: Secondary | ICD-10-CM | POA: Diagnosis not present

## 2018-11-23 DIAGNOSIS — E785 Hyperlipidemia, unspecified: Secondary | ICD-10-CM | POA: Diagnosis not present

## 2018-11-23 DIAGNOSIS — Z0000 Encounter for general adult medical examination without abnormal findings: Secondary | ICD-10-CM | POA: Diagnosis not present

## 2018-12-09 DIAGNOSIS — H353120 Nonexudative age-related macular degeneration, left eye, stage unspecified: Secondary | ICD-10-CM | POA: Diagnosis not present

## 2018-12-20 DIAGNOSIS — R69 Illness, unspecified: Secondary | ICD-10-CM | POA: Diagnosis not present

## 2018-12-21 ENCOUNTER — Ambulatory Visit: Payer: Medicare HMO | Admitting: Podiatry

## 2018-12-21 ENCOUNTER — Other Ambulatory Visit: Payer: Self-pay

## 2018-12-21 ENCOUNTER — Encounter: Payer: Self-pay | Admitting: Podiatry

## 2018-12-21 VITALS — Temp 97.7°F

## 2018-12-21 DIAGNOSIS — M79676 Pain in unspecified toe(s): Secondary | ICD-10-CM

## 2018-12-21 DIAGNOSIS — B351 Tinea unguium: Secondary | ICD-10-CM | POA: Diagnosis not present

## 2018-12-21 DIAGNOSIS — E119 Type 2 diabetes mellitus without complications: Secondary | ICD-10-CM | POA: Diagnosis not present

## 2018-12-21 NOTE — Progress Notes (Signed)
Patient ID: Isabel Weeks, female   DOB: Feb 08, 1951, 68 y.o.   MRN: [id number] Complaint:  Visit Type: Patient presents  to my office for continued preventative foot care services. Complaint: Patient states" my nails have grown long and thick and become painful to walk and wear shoes" Patient has been diagnosed with DM with no foot complications. The patient presents for preventative foot care services. Patient says she is starting to take lisinopril.  Podiatric Exam: Vascular: dorsalis pedis and posterior tibial pulses are palpable bilateral. Capillary return is immediate. Temperature gradient is WNL. Skin turgor WNL  Sensorium: Normal Semmes Weinstein monofilament test. Normal tactile sensation bilaterally. Nail Exam: Pt has thick disfigured discolored nails with subungual debris noted bilateral entire nail hallux  Ulcer Exam: There is no evidence of ulcer or pre-ulcerative changes or infection. Orthopedic Exam: Muscle tone and strength are WNL. No limitations in general ROM. No crepitus or effusions noted. Foot type and digits show no abnormalities. Bony prominences are unremarkable. 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 Skin: No Porokeratosis. No infection or ulcers  Diagnosis:  Onychomycosis, , Pain in right toe, pain in left toes  Treatment & Plan Procedures and Treatment: Consent by patient was obtained for treatment procedures. The patient understood the discussion of treatment and procedures well. All questions were answered thoroughly reviewed. Debridement of mycotic and hypertrophic toenails, 1 through 5 bilateral and clearing of subungual debris. No ulceration, no infection noted.  Return Visit-Office Procedure: Patient instructed to return to the office for a follow up visit 3 months for continued evaluation and treatment.    Gardiner Barefoot DPM

## 2018-12-26 DIAGNOSIS — E119 Type 2 diabetes mellitus without complications: Secondary | ICD-10-CM | POA: Diagnosis not present

## 2018-12-26 DIAGNOSIS — Z7984 Long term (current) use of oral hypoglycemic drugs: Secondary | ICD-10-CM | POA: Diagnosis not present

## 2018-12-26 DIAGNOSIS — Z86010 Personal history of colonic polyps: Secondary | ICD-10-CM | POA: Diagnosis not present

## 2019-02-01 ENCOUNTER — Other Ambulatory Visit: Payer: Self-pay | Admitting: Family Medicine

## 2019-02-01 DIAGNOSIS — Z1231 Encounter for screening mammogram for malignant neoplasm of breast: Secondary | ICD-10-CM

## 2019-02-06 DIAGNOSIS — Z1159 Encounter for screening for other viral diseases: Secondary | ICD-10-CM | POA: Diagnosis not present

## 2019-02-09 DIAGNOSIS — K648 Other hemorrhoids: Secondary | ICD-10-CM | POA: Diagnosis not present

## 2019-02-09 DIAGNOSIS — Z86010 Personal history of colonic polyps: Secondary | ICD-10-CM | POA: Diagnosis not present

## 2019-02-10 DIAGNOSIS — H35423 Microcystoid degeneration of retina, bilateral: Secondary | ICD-10-CM | POA: Diagnosis not present

## 2019-02-10 DIAGNOSIS — E113291 Type 2 diabetes mellitus with mild nonproliferative diabetic retinopathy without macular edema, right eye: Secondary | ICD-10-CM | POA: Diagnosis not present

## 2019-02-10 DIAGNOSIS — H35372 Puckering of macula, left eye: Secondary | ICD-10-CM | POA: Diagnosis not present

## 2019-02-10 DIAGNOSIS — H353134 Nonexudative age-related macular degeneration, bilateral, advanced atrophic with subfoveal involvement: Secondary | ICD-10-CM | POA: Diagnosis not present

## 2019-02-11 DIAGNOSIS — R69 Illness, unspecified: Secondary | ICD-10-CM | POA: Diagnosis not present

## 2019-02-22 DIAGNOSIS — E785 Hyperlipidemia, unspecified: Secondary | ICD-10-CM | POA: Diagnosis not present

## 2019-02-22 DIAGNOSIS — E1169 Type 2 diabetes mellitus with other specified complication: Secondary | ICD-10-CM | POA: Diagnosis not present

## 2019-02-22 DIAGNOSIS — Z7984 Long term (current) use of oral hypoglycemic drugs: Secondary | ICD-10-CM | POA: Diagnosis not present

## 2019-02-22 DIAGNOSIS — I10 Essential (primary) hypertension: Secondary | ICD-10-CM | POA: Diagnosis not present

## 2019-03-21 ENCOUNTER — Ambulatory Visit
Admission: RE | Admit: 2019-03-21 | Discharge: 2019-03-21 | Disposition: A | Discharge status: Home / Self Care | Payer: Medicare HMO | Source: Ambulatory Visit | Attending: Family Medicine | Admitting: Family Medicine

## 2019-03-21 ENCOUNTER — Other Ambulatory Visit: Payer: Self-pay

## 2019-03-21 DIAGNOSIS — Z1231 Encounter for screening mammogram for malignant neoplasm of breast: Secondary | ICD-10-CM

## 2019-03-29 ENCOUNTER — Ambulatory Visit: Payer: Medicare HMO | Admitting: Podiatry

## 2019-03-29 ENCOUNTER — Other Ambulatory Visit: Payer: Self-pay

## 2019-03-29 ENCOUNTER — Encounter: Payer: Self-pay | Admitting: Podiatry

## 2019-03-29 DIAGNOSIS — M79676 Pain in unspecified toe(s): Secondary | ICD-10-CM | POA: Diagnosis not present

## 2019-03-29 DIAGNOSIS — E119 Type 2 diabetes mellitus without complications: Secondary | ICD-10-CM | POA: Diagnosis not present

## 2019-03-29 DIAGNOSIS — B351 Tinea unguium: Secondary | ICD-10-CM

## 2019-03-29 NOTE — Progress Notes (Signed)
Patient ID: Isabel Weeks, female   DOB: 1950/05/25, 68 y.o.   MRN: [id number] Complaint:  Visit Type: Patient presents  to my office for continued preventative foot care services. Complaint: Patient states" my nails have grown long and thick and become painful to walk and wear shoes" Patient has been diagnosed with DM with no foot complications. The patient presents for preventative foot care services. Patient says she is starting to take lisinopril.  Podiatric Exam: Vascular: dorsalis pedis and posterior tibial pulses are palpable bilateral. Capillary return is immediate. Temperature gradient is WNL. Skin turgor WNL  Sensorium: Normal Semmes Weinstein monofilament test. Normal tactile sensation bilaterally. Nail Exam: Pt has thick disfigured discolored nails with subungual debris noted bilateral entire nail hallux  Ulcer Exam: There is no evidence of ulcer or pre-ulcerative changes or infection. Orthopedic Exam: Muscle tone and strength are WNL. No limitations in general ROM. No crepitus or effusions noted. Foot type and digits show no abnormalities. Bony prominences are unremarkable. 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 Skin: No Porokeratosis. No infection or ulcers  Diagnosis:  Onychomycosis, , Pain in right toe, pain in left toes  Treatment & Plan Procedures and Treatment: Consent by patient was obtained for treatment procedures. The patient understood the discussion of treatment and procedures well. All questions were answered thoroughly reviewed. Debridement of mycotic and hypertrophic toenails, 1 through 5 bilateral and clearing of subungual debris. No ulceration, no infection noted.  Return Visit-Office Procedure: Patient instructed to return to the office for a follow up visit 3 months for continued evaluation and treatment.    Gardiner Barefoot DPM

## 2019-05-30 ENCOUNTER — Ambulatory Visit: Payer: Medicare HMO

## 2019-06-08 ENCOUNTER — Ambulatory Visit: Payer: Medicare HMO | Attending: Internal Medicine

## 2019-06-08 DIAGNOSIS — Z23 Encounter for immunization: Secondary | ICD-10-CM

## 2019-06-08 NOTE — Progress Notes (Signed)
   Covid-19 Vaccination Clinic  Name:  Isabel Weeks    MRN: [id number] DOB: Mar 05, 1951  06/08/2019  Ms. Camuso was observed post Covid-19 immunization for 15 minutes without incidence. She was provided with Vaccine Information Sheet and instruction to access the V-Safe system.   Ms. Hammersmith was instructed to call 911 with any severe reactions post vaccine: Marland Kitchen Difficulty breathing  . Swelling of your face and throat  . A fast heartbeat  . A bad rash all over your body  . Dizziness and weakness    Immunizations Administered    Name Date Dose VIS Date Route   Pfizer COVID-19 Vaccine 06/08/2019  9:49 AM 0.3 mL 04/14/2019 Intramuscular   Manufacturer: [place]   Lot: [id number]   [place]: [id number]

## 2019-06-10 ENCOUNTER — Ambulatory Visit: Payer: Medicare HMO

## 2019-06-26 DIAGNOSIS — E1169 Type 2 diabetes mellitus with other specified complication: Secondary | ICD-10-CM | POA: Diagnosis not present

## 2019-06-26 DIAGNOSIS — E785 Hyperlipidemia, unspecified: Secondary | ICD-10-CM | POA: Diagnosis not present

## 2019-06-26 DIAGNOSIS — I10 Essential (primary) hypertension: Secondary | ICD-10-CM | POA: Diagnosis not present

## 2019-06-28 ENCOUNTER — Ambulatory Visit: Payer: Medicare HMO | Admitting: Podiatry

## 2019-06-28 ENCOUNTER — Encounter: Payer: Self-pay | Admitting: Podiatry

## 2019-06-28 ENCOUNTER — Other Ambulatory Visit: Payer: Self-pay

## 2019-06-28 DIAGNOSIS — E119 Type 2 diabetes mellitus without complications: Secondary | ICD-10-CM | POA: Diagnosis not present

## 2019-06-28 DIAGNOSIS — B351 Tinea unguium: Secondary | ICD-10-CM

## 2019-06-28 DIAGNOSIS — M79676 Pain in unspecified toe(s): Secondary | ICD-10-CM

## 2019-06-28 NOTE — Progress Notes (Signed)
Patient ID: Isabel Weeks, female   DOB: 01-10-1951, 69 y.o.   MRN: [id number] Complaint:  Visit Type: Patient presents  to my office for continued preventative foot care services. Complaint: Patient states" my nails have grown long and thick and become painful to walk and wear shoes" Patient has been diagnosed with DM with no foot complications. The patient presents for preventative foot care services.  Podiatric Exam: Vascular: dorsalis pedis and posterior tibial pulses are palpable bilateral. Capillary return is immediate. Temperature gradient is WNL. Skin turgor WNL  Sensorium: Normal Semmes Weinstein monofilament test. Normal tactile sensation bilaterally. Nail Exam: Pt has thick disfigured discolored nails with subungual debris noted bilateral entire nail hallux  Ulcer Exam: There is no evidence of ulcer or pre-ulcerative changes or infection. Orthopedic Exam: Muscle tone and strength are WNL. No limitations in general ROM. No crepitus or effusions noted. Foot type and digits show no abnormalities. Bony prominences are unremarkable. 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 Skin: No Porokeratosis. No infection or ulcers  Diagnosis:  Onychomycosis, , Pain in right toe, pain in left toes  Treatment & Plan Procedures and Treatment: Consent by patient was obtained for treatment procedures. The patient understood the discussion of treatment and procedures well. All questions were answered thoroughly reviewed. Debridement of mycotic and hypertrophic toenails, 1 through 5 bilateral and clearing of subungual debris. No ulceration, no infection noted. Visual inspection of the diabetic foot was performed. Return Visit-Office Procedure: Patient instructed to return to the office for a follow up visit 3 months for continued evaluation and treatment.    Gardiner Barefoot DPM

## 2019-07-03 ENCOUNTER — Ambulatory Visit: Payer: Medicare HMO | Attending: Internal Medicine

## 2019-07-03 DIAGNOSIS — Z23 Encounter for immunization: Secondary | ICD-10-CM

## 2019-07-03 NOTE — Progress Notes (Signed)
   Covid-19 Vaccination Clinic  Name:  Isabel Weeks    MRN: [id number] DOB: 1950/06/28  07/03/2019  Isabel Weeks was observed post Covid-19 immunization for 15 minutes without incidence. She was provided with Vaccine Information Sheet and instruction to access the V-Safe system.   Isabel Weeks was instructed to call 911 with any severe reactions post vaccine: Marland Kitchen Difficulty breathing  . Swelling of your face and throat  . A fast heartbeat  . A bad rash all over your body  . Dizziness and weakness    Immunizations Administered    Name Date Dose VIS Date Route   Pfizer COVID-19 Vaccine 07/03/2019  2:18 PM 0.3 mL 04/14/2019 Intramuscular   Manufacturer: [place]   Lot: [id number]   [place]: [id number]

## 2019-07-05 DIAGNOSIS — R69 Illness, unspecified: Secondary | ICD-10-CM | POA: Diagnosis not present

## 2019-07-12 DIAGNOSIS — R69 Illness, unspecified: Secondary | ICD-10-CM | POA: Diagnosis not present

## 2019-08-09 DIAGNOSIS — H35372 Puckering of macula, left eye: Secondary | ICD-10-CM | POA: Diagnosis not present

## 2019-08-09 DIAGNOSIS — H43813 Vitreous degeneration, bilateral: Secondary | ICD-10-CM | POA: Diagnosis not present

## 2019-08-09 DIAGNOSIS — E113291 Type 2 diabetes mellitus with mild nonproliferative diabetic retinopathy without macular edema, right eye: Secondary | ICD-10-CM | POA: Diagnosis not present

## 2019-08-09 DIAGNOSIS — H353134 Nonexudative age-related macular degeneration, bilateral, advanced atrophic with subfoveal involvement: Secondary | ICD-10-CM | POA: Diagnosis not present

## 2019-08-21 DIAGNOSIS — L570 Actinic keratosis: Secondary | ICD-10-CM | POA: Diagnosis not present

## 2019-08-21 DIAGNOSIS — D2239 Melanocytic nevi of other parts of face: Secondary | ICD-10-CM | POA: Diagnosis not present

## 2019-08-21 DIAGNOSIS — Z85828 Personal history of other malignant neoplasm of skin: Secondary | ICD-10-CM | POA: Diagnosis not present

## 2019-08-21 DIAGNOSIS — L814 Other melanin hyperpigmentation: Secondary | ICD-10-CM | POA: Diagnosis not present

## 2019-08-21 DIAGNOSIS — D2262 Melanocytic nevi of left upper limb, including shoulder: Secondary | ICD-10-CM | POA: Diagnosis not present

## 2019-08-21 DIAGNOSIS — D224 Melanocytic nevi of scalp and neck: Secondary | ICD-10-CM | POA: Diagnosis not present

## 2019-08-21 DIAGNOSIS — L821 Other seborrheic keratosis: Secondary | ICD-10-CM | POA: Diagnosis not present

## 2019-08-21 DIAGNOSIS — D2271 Melanocytic nevi of right lower limb, including hip: Secondary | ICD-10-CM | POA: Diagnosis not present

## 2019-08-21 DIAGNOSIS — D225 Melanocytic nevi of trunk: Secondary | ICD-10-CM | POA: Diagnosis not present

## 2019-08-21 DIAGNOSIS — D2272 Melanocytic nevi of left lower limb, including hip: Secondary | ICD-10-CM | POA: Diagnosis not present

## 2019-09-04 DIAGNOSIS — H26492 Other secondary cataract, left eye: Secondary | ICD-10-CM | POA: Diagnosis not present

## 2019-09-04 DIAGNOSIS — Z961 Presence of intraocular lens: Secondary | ICD-10-CM | POA: Diagnosis not present

## 2019-09-27 ENCOUNTER — Other Ambulatory Visit: Payer: Self-pay

## 2019-09-27 ENCOUNTER — Ambulatory Visit: Payer: Medicare HMO | Admitting: Podiatry

## 2019-09-27 ENCOUNTER — Encounter: Payer: Self-pay | Admitting: Podiatry

## 2019-09-27 DIAGNOSIS — E119 Type 2 diabetes mellitus without complications: Secondary | ICD-10-CM

## 2019-09-27 DIAGNOSIS — M79676 Pain in unspecified toe(s): Secondary | ICD-10-CM

## 2019-09-27 DIAGNOSIS — B351 Tinea unguium: Secondary | ICD-10-CM

## 2019-09-27 NOTE — Progress Notes (Signed)
This patient returns to my office for at risk foot care.  This patient requires this care by a professional since this patient will be at risk due to having type 2 diabetes.    This patient is unable to cut nails herself since the patient cannot reach her nails.These nails are painful walking and wearing shoes.  This patient presents for at risk foot care today.  General Appearance  Alert, conversant and in no acute stress.  Vascular  Dorsalis pedis and posterior tibial  pulses are palpable  bilaterally.  Capillary return is within normal limits  bilaterally. Temperature is within normal limits  bilaterally.  Neurologic  Senn-Weinstein monofilament wire test within normal limits  bilaterally. Muscle power within normal limits bilaterally.  Nails Thick disfigured discolored nails with subungual debris  from hallux to fifth toes bilaterally. No evidence of bacterial infection or drainage bilaterally.  Orthopedic  No limitations of motion  feet .  No crepitus or effusions noted.  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  Skin  normotropic skin with no porokeratosis noted bilaterally.  No signs of infections or ulcers noted.     Onychomycosis  Pain in right toes  Pain in left toes  Consent was obtained for treatment procedures.   Mechanical debridement of nails 1-5  bilaterally performed with a nail nipper.  Filed with dremel without incident.    Return office visit  3 months                   Told patient to return for periodic foot care and evaluation due to potential at risk complications.  Visual inspection of feet was performed.   Gardiner Barefoot DPM

## 2019-09-28 DIAGNOSIS — I10 Essential (primary) hypertension: Secondary | ICD-10-CM | POA: Diagnosis not present

## 2019-09-28 DIAGNOSIS — E1169 Type 2 diabetes mellitus with other specified complication: Secondary | ICD-10-CM | POA: Diagnosis not present

## 2019-09-28 DIAGNOSIS — E785 Hyperlipidemia, unspecified: Secondary | ICD-10-CM | POA: Diagnosis not present

## 2019-10-30 DIAGNOSIS — M79671 Pain in right foot: Secondary | ICD-10-CM | POA: Diagnosis not present

## 2019-12-07 DIAGNOSIS — I10 Essential (primary) hypertension: Secondary | ICD-10-CM | POA: Diagnosis not present

## 2019-12-07 DIAGNOSIS — Z0000 Encounter for general adult medical examination without abnormal findings: Secondary | ICD-10-CM | POA: Diagnosis not present

## 2019-12-07 DIAGNOSIS — R42 Dizziness and giddiness: Secondary | ICD-10-CM | POA: Diagnosis not present

## 2019-12-07 DIAGNOSIS — E1169 Type 2 diabetes mellitus with other specified complication: Secondary | ICD-10-CM | POA: Diagnosis not present

## 2019-12-07 DIAGNOSIS — E785 Hyperlipidemia, unspecified: Secondary | ICD-10-CM | POA: Diagnosis not present

## 2019-12-07 DIAGNOSIS — E113293 Type 2 diabetes mellitus with mild nonproliferative diabetic retinopathy without macular edema, bilateral: Secondary | ICD-10-CM | POA: Diagnosis not present

## 2019-12-07 DIAGNOSIS — M79671 Pain in right foot: Secondary | ICD-10-CM | POA: Diagnosis not present

## 2019-12-20 ENCOUNTER — Encounter: Payer: Self-pay | Admitting: Podiatry

## 2019-12-20 ENCOUNTER — Ambulatory Visit: Payer: Medicare HMO | Admitting: Podiatry

## 2019-12-20 ENCOUNTER — Other Ambulatory Visit: Payer: Self-pay

## 2019-12-20 DIAGNOSIS — E119 Type 2 diabetes mellitus without complications: Secondary | ICD-10-CM

## 2019-12-20 DIAGNOSIS — B351 Tinea unguium: Secondary | ICD-10-CM

## 2019-12-20 DIAGNOSIS — M79676 Pain in unspecified toe(s): Secondary | ICD-10-CM

## 2019-12-20 DIAGNOSIS — S93509S Unspecified sprain of unspecified toe(s), sequela: Secondary | ICD-10-CM | POA: Diagnosis not present

## 2019-12-20 NOTE — Progress Notes (Signed)
This patient returns to my office for at risk foot care.  This patient requires this care by a professional since this patient will be at risk due to having type 2 diabetes.    This patient is unable to cut nails herself since the patient cannot reach her nails.These nails are painful walking and wearing shoes.  This patient presents for at risk foot care today.  Patient states she has pain in her fifth toe right foot for 2 months.  She says she injured her toe at church.  General Appearance  Alert, conversant and in no acute stress.  Vascular  Dorsalis pedis and posterior tibial  pulses are palpable  bilaterally.  Capillary return is within normal limits  bilaterally. Temperature is within normal limits  bilaterally.  Neurologic  Senn-Weinstein monofilament wire test within normal limits  bilaterally. Muscle power within normal limits bilaterally.  Nails Thick disfigured discolored nails with subungual debris  from hallux to fifth toes bilaterally. No evidence of bacterial infection or drainage bilaterally.  Orthopedic  No limitations of motion  feet .  No crepitus or effusions noted.  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 No redness or swelling or increased temperature fifth toe right foot.  Skin  normotropic skin with no porokeratosis noted bilaterally.  No signs of infections or ulcers noted.     Onychomycosis  Pain in right toes  Pain in left toes  Toe sprain fifth toe right foot.  Consent was obtained for treatment procedures.   Mechanical debridement of nails 1-5  bilaterally performed with a nail nipper.  Filed with dremel without incident. Buddy tape fifth toe right foot.   Return office visit  3 months                   Told patient to return for periodic foot care and evaluation due to potential at risk complications.  Visual inspection of feet was performed.   Gardiner Barefoot DPM

## 2019-12-29 DIAGNOSIS — E1169 Type 2 diabetes mellitus with other specified complication: Secondary | ICD-10-CM | POA: Diagnosis not present

## 2019-12-29 DIAGNOSIS — E785 Hyperlipidemia, unspecified: Secondary | ICD-10-CM | POA: Diagnosis not present

## 2019-12-29 DIAGNOSIS — Z0000 Encounter for general adult medical examination without abnormal findings: Secondary | ICD-10-CM | POA: Diagnosis not present

## 2019-12-29 DIAGNOSIS — R42 Dizziness and giddiness: Secondary | ICD-10-CM | POA: Diagnosis not present

## 2020-01-16 DIAGNOSIS — R69 Illness, unspecified: Secondary | ICD-10-CM | POA: Diagnosis not present

## 2020-01-26 DIAGNOSIS — R69 Illness, unspecified: Secondary | ICD-10-CM | POA: Diagnosis not present

## 2020-02-07 DIAGNOSIS — E113293 Type 2 diabetes mellitus with mild nonproliferative diabetic retinopathy without macular edema, bilateral: Secondary | ICD-10-CM | POA: Diagnosis not present

## 2020-02-07 DIAGNOSIS — H35372 Puckering of macula, left eye: Secondary | ICD-10-CM | POA: Diagnosis not present

## 2020-02-07 DIAGNOSIS — H353134 Nonexudative age-related macular degeneration, bilateral, advanced atrophic with subfoveal involvement: Secondary | ICD-10-CM | POA: Diagnosis not present

## 2020-02-07 DIAGNOSIS — H43813 Vitreous degeneration, bilateral: Secondary | ICD-10-CM | POA: Diagnosis not present

## 2020-03-05 ENCOUNTER — Other Ambulatory Visit: Payer: Self-pay | Admitting: Family Medicine

## 2020-03-05 DIAGNOSIS — Z1231 Encounter for screening mammogram for malignant neoplasm of breast: Secondary | ICD-10-CM

## 2020-03-27 ENCOUNTER — Encounter: Payer: Self-pay | Admitting: Podiatry

## 2020-03-27 ENCOUNTER — Ambulatory Visit: Payer: Medicare HMO | Admitting: Podiatry

## 2020-03-27 ENCOUNTER — Other Ambulatory Visit: Payer: Self-pay

## 2020-03-27 DIAGNOSIS — E119 Type 2 diabetes mellitus without complications: Secondary | ICD-10-CM

## 2020-03-27 DIAGNOSIS — M79676 Pain in unspecified toe(s): Secondary | ICD-10-CM

## 2020-03-27 DIAGNOSIS — B351 Tinea unguium: Secondary | ICD-10-CM

## 2020-03-27 NOTE — Progress Notes (Signed)
This patient returns to my office for at risk foot care.  This patient requires this care by a professional since this patient will be at risk due to having type 2 diabetes.    This patient is unable to cut nails herself since the patient cannot reach her nails.These nails are painful walking and wearing shoes.  This patient presents for at risk foot care today.    General Appearance  Alert, conversant and in no acute stress.  Vascular  Dorsalis pedis and posterior tibial  pulses are not  palpable  bilaterally.  Capillary return is within normal limits  bilaterally. Temperature is within normal limits  Bilaterally.  Absent hair.  Neurologic  Senn-Weinstein monofilament wire test within normal limits  bilaterally. Muscle power within normal limits bilaterally.  Nails Thick disfigured discolored nails with subungual debris  from hallux to fifth toes bilaterally. No evidence of bacterial infection or drainage bilaterally.  Orthopedic  No limitations of motion  feet .  No crepitus or effusions noted.  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 No redness or swelling or increased temperature fifth toe right foot.  Skin  normotropic skin with no porokeratosis noted bilaterally.  No signs of infections or ulcers noted.     Onychomycosis  Pain in right toes  Pain in left toes  Toe sprain fifth toe right foot.  Consent was obtained for treatment procedures.   Mechanical debridement of nails 1-5  bilaterally performed with a nail nipper.  Filed with dremel without incident. Buddy tape fifth toe right foot.   Return office visit  3 months                   Told patient to return for periodic foot care and evaluation due to potential at risk complications.  Visual inspection of feet was performed.   Falynn Ailey DPM  

## 2020-04-03 DIAGNOSIS — I10 Essential (primary) hypertension: Secondary | ICD-10-CM | POA: Diagnosis not present

## 2020-04-03 DIAGNOSIS — E785 Hyperlipidemia, unspecified: Secondary | ICD-10-CM | POA: Diagnosis not present

## 2020-04-03 DIAGNOSIS — E1169 Type 2 diabetes mellitus with other specified complication: Secondary | ICD-10-CM | POA: Diagnosis not present

## 2020-04-03 DIAGNOSIS — M810 Age-related osteoporosis without current pathological fracture: Secondary | ICD-10-CM | POA: Diagnosis not present

## 2020-04-12 ENCOUNTER — Ambulatory Visit
Admission: RE | Admit: 2020-04-12 | Discharge: 2020-04-12 | Disposition: A | Discharge status: Home / Self Care | Payer: Medicare HMO | Source: Ambulatory Visit | Attending: Family Medicine | Admitting: Family Medicine

## 2020-04-12 ENCOUNTER — Other Ambulatory Visit: Payer: Self-pay

## 2020-04-12 DIAGNOSIS — Z1231 Encounter for screening mammogram for malignant neoplasm of breast: Secondary | ICD-10-CM

## 2020-04-17 ENCOUNTER — Other Ambulatory Visit: Payer: Self-pay | Admitting: Family Medicine

## 2020-04-17 DIAGNOSIS — R928 Other abnormal and inconclusive findings on diagnostic imaging of breast: Secondary | ICD-10-CM

## 2020-04-30 ENCOUNTER — Ambulatory Visit: Payer: Medicare HMO

## 2020-04-30 ENCOUNTER — Ambulatory Visit
Admission: RE | Admit: 2020-04-30 | Discharge: 2020-04-30 | Disposition: A | Discharge status: Home / Self Care | Payer: Medicare HMO | Source: Ambulatory Visit | Attending: Family Medicine | Admitting: Family Medicine

## 2020-04-30 ENCOUNTER — Other Ambulatory Visit: Payer: Self-pay

## 2020-04-30 DIAGNOSIS — R928 Other abnormal and inconclusive findings on diagnostic imaging of breast: Secondary | ICD-10-CM

## 2020-06-19 ENCOUNTER — Encounter: Payer: Self-pay | Admitting: Podiatry

## 2020-06-19 ENCOUNTER — Ambulatory Visit (INDEPENDENT_AMBULATORY_CARE_PROVIDER_SITE_OTHER): Payer: Medicare HMO | Admitting: Podiatry

## 2020-06-19 ENCOUNTER — Other Ambulatory Visit: Payer: Self-pay

## 2020-06-19 DIAGNOSIS — M79676 Pain in unspecified toe(s): Secondary | ICD-10-CM | POA: Diagnosis not present

## 2020-06-19 DIAGNOSIS — E119 Type 2 diabetes mellitus without complications: Secondary | ICD-10-CM | POA: Diagnosis not present

## 2020-06-19 DIAGNOSIS — B351 Tinea unguium: Secondary | ICD-10-CM | POA: Diagnosis not present

## 2020-06-19 NOTE — Progress Notes (Signed)
This patient returns to my office for at risk foot care.  This patient requires this care by a professional since this patient will be at risk due to having type 2 diabetes.    This patient is unable to cut nails herself since the patient cannot reach her nails.These nails are painful walking and wearing shoes.  This patient presents for at risk foot care today.    General Appearance  Alert, conversant and in no acute stress.  Vascular  Dorsalis pedis and posterior tibial  pulses are not  palpable  bilaterally.  Capillary return is within normal limits  bilaterally. Temperature is within normal limits  Bilaterally.  Absent hair.  Neurologic  Senn-Weinstein monofilament wire test within normal limits  bilaterally. Muscle power within normal limits bilaterally.  Nails Thick disfigured discolored nails with subungual debris  from hallux to fifth toes bilaterally. No evidence of bacterial infection or drainage bilaterally.  Orthopedic  No limitations of motion  feet .  No crepitus or effusions noted.  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 No redness or swelling or increased temperature fifth toe right foot.  Skin  normotropic skin with no porokeratosis noted bilaterally.  No signs of infections or ulcers noted.     Onychomycosis  Pain in right toes  Pain in left toes  Toe sprain fifth toe right foot.  Consent was obtained for treatment procedures.   Mechanical debridement of nails 1-5  bilaterally performed with a nail nipper.  Filed with dremel without incident. Buddy tape fifth toe right foot.   Return office visit  3 months                   Told patient to return for periodic foot care and evaluation due to potential at risk complications.  Visual inspection of feet was performed.   Gardiner Barefoot DPM

## 2020-07-02 DIAGNOSIS — E1169 Type 2 diabetes mellitus with other specified complication: Secondary | ICD-10-CM | POA: Diagnosis not present

## 2020-07-02 DIAGNOSIS — E785 Hyperlipidemia, unspecified: Secondary | ICD-10-CM | POA: Diagnosis not present

## 2020-07-02 DIAGNOSIS — I10 Essential (primary) hypertension: Secondary | ICD-10-CM | POA: Diagnosis not present

## 2020-09-04 DIAGNOSIS — H04123 Dry eye syndrome of bilateral lacrimal glands: Secondary | ICD-10-CM | POA: Diagnosis not present

## 2020-09-04 DIAGNOSIS — Z961 Presence of intraocular lens: Secondary | ICD-10-CM | POA: Diagnosis not present

## 2020-09-04 DIAGNOSIS — H353134 Nonexudative age-related macular degeneration, bilateral, advanced atrophic with subfoveal involvement: Secondary | ICD-10-CM | POA: Diagnosis not present

## 2020-09-04 DIAGNOSIS — E113293 Type 2 diabetes mellitus with mild nonproliferative diabetic retinopathy without macular edema, bilateral: Secondary | ICD-10-CM | POA: Diagnosis not present

## 2020-09-13 DIAGNOSIS — L570 Actinic keratosis: Secondary | ICD-10-CM | POA: Diagnosis not present

## 2020-09-13 DIAGNOSIS — L814 Other melanin hyperpigmentation: Secondary | ICD-10-CM | POA: Diagnosis not present

## 2020-09-13 DIAGNOSIS — L821 Other seborrheic keratosis: Secondary | ICD-10-CM | POA: Diagnosis not present

## 2020-09-13 DIAGNOSIS — D485 Neoplasm of uncertain behavior of skin: Secondary | ICD-10-CM | POA: Diagnosis not present

## 2020-09-13 DIAGNOSIS — D224 Melanocytic nevi of scalp and neck: Secondary | ICD-10-CM | POA: Diagnosis not present

## 2020-09-13 DIAGNOSIS — Z85828 Personal history of other malignant neoplasm of skin: Secondary | ICD-10-CM | POA: Diagnosis not present

## 2020-09-18 ENCOUNTER — Encounter: Payer: Self-pay | Admitting: Podiatry

## 2020-09-18 ENCOUNTER — Ambulatory Visit: Payer: Medicare HMO | Admitting: Podiatry

## 2020-09-18 ENCOUNTER — Other Ambulatory Visit: Payer: Self-pay

## 2020-09-18 DIAGNOSIS — G5600 Carpal tunnel syndrome, unspecified upper limb: Secondary | ICD-10-CM | POA: Insufficient documentation

## 2020-09-18 DIAGNOSIS — E785 Hyperlipidemia, unspecified: Secondary | ICD-10-CM | POA: Insufficient documentation

## 2020-09-18 DIAGNOSIS — Z860100 Personal history of colon polyps, unspecified: Secondary | ICD-10-CM | POA: Insufficient documentation

## 2020-09-18 DIAGNOSIS — R946 Abnormal results of thyroid function studies: Secondary | ICD-10-CM | POA: Insufficient documentation

## 2020-09-18 DIAGNOSIS — H543 Unqualified visual loss, both eyes: Secondary | ICD-10-CM | POA: Insufficient documentation

## 2020-09-18 DIAGNOSIS — E119 Type 2 diabetes mellitus without complications: Secondary | ICD-10-CM | POA: Diagnosis not present

## 2020-09-18 DIAGNOSIS — E2839 Other primary ovarian failure: Secondary | ICD-10-CM | POA: Insufficient documentation

## 2020-09-18 DIAGNOSIS — M810 Age-related osteoporosis without current pathological fracture: Secondary | ICD-10-CM | POA: Insufficient documentation

## 2020-09-18 DIAGNOSIS — I10 Essential (primary) hypertension: Secondary | ICD-10-CM | POA: Insufficient documentation

## 2020-09-18 DIAGNOSIS — B351 Tinea unguium: Secondary | ICD-10-CM | POA: Diagnosis not present

## 2020-09-18 DIAGNOSIS — M79676 Pain in unspecified toe(s): Secondary | ICD-10-CM

## 2020-09-18 DIAGNOSIS — H9190 Unspecified hearing loss, unspecified ear: Secondary | ICD-10-CM | POA: Insufficient documentation

## 2020-09-18 DIAGNOSIS — E113293 Type 2 diabetes mellitus with mild nonproliferative diabetic retinopathy without macular edema, bilateral: Secondary | ICD-10-CM | POA: Insufficient documentation

## 2020-09-18 DIAGNOSIS — E1169 Type 2 diabetes mellitus with other specified complication: Secondary | ICD-10-CM | POA: Insufficient documentation

## 2020-09-18 DIAGNOSIS — Q159 Congenital malformation of eye, unspecified: Secondary | ICD-10-CM | POA: Insufficient documentation

## 2020-09-18 NOTE — Progress Notes (Signed)
This patient returns to my office for at risk foot care.  This patient requires this care by a professional since this patient will be at risk due to having type 2 diabetes.    This patient is unable to cut nails herself since the patient cannot reach her nails.These nails are painful walking and wearing shoes.  This patient presents for at risk foot care today.    General Appearance  Alert, conversant and in no acute stress.  Vascular  Dorsalis pedis and posterior tibial  pulses are not  palpable  bilaterally.  Capillary return is within normal limits  bilaterally. Temperature is within normal limits  Bilaterally.  Absent hair.  Neurologic  Senn-Weinstein monofilament wire test within normal limits  bilaterally. Muscle power within normal limits bilaterally.  Nails Thick disfigured discolored nails with subungual debris  from hallux to fifth toes bilaterally. No evidence of bacterial infection or drainage bilaterally.  Orthopedic  No limitations of motion  feet .  No crepitus or effusions noted.  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   Skin  normotropic skin with no porokeratosis noted bilaterally.  No signs of infections or ulcers noted.     Onychomycosis  Pain in right toes  Pain in left toes  Toe sprain fifth toe right foot.  Consent was obtained for treatment procedures.   Mechanical debridement of nails 1-5  bilaterally performed with a nail nipper.  Filed with dremel without incident. Buddy tape fifth toe right foot.   Return office visit  3 months                   Told patient to return for periodic foot care and evaluation due to potential at risk complications.  Visual inspection of feet was performed.   Gardiner Barefoot DPM

## 2020-09-20 DIAGNOSIS — E113293 Type 2 diabetes mellitus with mild nonproliferative diabetic retinopathy without macular edema, bilateral: Secondary | ICD-10-CM | POA: Diagnosis not present

## 2020-09-20 DIAGNOSIS — H35423 Microcystoid degeneration of retina, bilateral: Secondary | ICD-10-CM | POA: Diagnosis not present

## 2020-09-20 DIAGNOSIS — H35372 Puckering of macula, left eye: Secondary | ICD-10-CM | POA: Diagnosis not present

## 2020-09-20 DIAGNOSIS — H353134 Nonexudative age-related macular degeneration, bilateral, advanced atrophic with subfoveal involvement: Secondary | ICD-10-CM | POA: Diagnosis not present

## 2020-10-31 ENCOUNTER — Encounter: Payer: Self-pay | Admitting: Podiatry

## 2020-10-31 ENCOUNTER — Telehealth: Payer: Self-pay | Admitting: Podiatry

## 2020-10-31 NOTE — Telephone Encounter (Signed)
Called patient lvm to reschedule 8/22 appt and sent reschedule letter

## 2020-11-08 DIAGNOSIS — E1165 Type 2 diabetes mellitus with hyperglycemia: Secondary | ICD-10-CM | POA: Diagnosis not present

## 2020-11-08 DIAGNOSIS — Z7982 Long term (current) use of aspirin: Secondary | ICD-10-CM | POA: Diagnosis not present

## 2020-11-08 DIAGNOSIS — E669 Obesity, unspecified: Secondary | ICD-10-CM | POA: Diagnosis not present

## 2020-11-08 DIAGNOSIS — Z6832 Body mass index (BMI) 32.0-32.9, adult: Secondary | ICD-10-CM | POA: Diagnosis not present

## 2020-11-08 DIAGNOSIS — E1142 Type 2 diabetes mellitus with diabetic polyneuropathy: Secondary | ICD-10-CM | POA: Diagnosis not present

## 2020-11-08 DIAGNOSIS — E11620 Type 2 diabetes mellitus with diabetic dermatitis: Secondary | ICD-10-CM | POA: Diagnosis not present

## 2020-11-08 DIAGNOSIS — Z823 Family history of stroke: Secondary | ICD-10-CM | POA: Diagnosis not present

## 2020-11-08 DIAGNOSIS — Z7984 Long term (current) use of oral hypoglycemic drugs: Secondary | ICD-10-CM | POA: Diagnosis not present

## 2020-11-08 DIAGNOSIS — I10 Essential (primary) hypertension: Secondary | ICD-10-CM | POA: Diagnosis not present

## 2020-11-08 DIAGNOSIS — E785 Hyperlipidemia, unspecified: Secondary | ICD-10-CM | POA: Diagnosis not present

## 2020-12-11 DIAGNOSIS — I10 Essential (primary) hypertension: Secondary | ICD-10-CM | POA: Diagnosis not present

## 2020-12-11 DIAGNOSIS — E1169 Type 2 diabetes mellitus with other specified complication: Secondary | ICD-10-CM | POA: Diagnosis not present

## 2020-12-11 DIAGNOSIS — Z0000 Encounter for general adult medical examination without abnormal findings: Secondary | ICD-10-CM | POA: Diagnosis not present

## 2020-12-11 DIAGNOSIS — E785 Hyperlipidemia, unspecified: Secondary | ICD-10-CM | POA: Diagnosis not present

## 2020-12-18 ENCOUNTER — Encounter: Payer: Self-pay | Admitting: Podiatry

## 2020-12-18 ENCOUNTER — Other Ambulatory Visit: Payer: Self-pay

## 2020-12-18 ENCOUNTER — Ambulatory Visit: Payer: Medicare HMO | Admitting: Podiatry

## 2020-12-18 DIAGNOSIS — E119 Type 2 diabetes mellitus without complications: Secondary | ICD-10-CM

## 2020-12-18 DIAGNOSIS — M79676 Pain in unspecified toe(s): Secondary | ICD-10-CM | POA: Diagnosis not present

## 2020-12-18 DIAGNOSIS — B351 Tinea unguium: Secondary | ICD-10-CM | POA: Diagnosis not present

## 2020-12-18 NOTE — Progress Notes (Signed)
This patient returns to my office for at risk foot care.  This patient requires this care by a professional since this patient will be at risk due to having type 2 diabetes.    This patient is unable to cut nails herself since the patient cannot reach her nails.These nails are painful walking and wearing shoes.  This patient presents for at risk foot care today.    General Appearance  Alert, conversant and in no acute stress.  Vascular  Dorsalis pedis and posterior tibial  pulses are not  palpable  bilaterally.  Capillary return is within normal limits  bilaterally. Temperature is within normal limits  Bilaterally.  Absent hair.  Neurologic  Senn-Weinstein monofilament wire test within normal limits  bilaterally. Muscle power within normal limits bilaterally.  Nails Thick disfigured discolored nails with subungual debris  from hallux to fifth toes bilaterally. No evidence of bacterial infection or drainage bilaterally.  Orthopedic  No limitations of motion  feet .  No crepitus or effusions noted.  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   Skin  normotropic skin with no porokeratosis noted bilaterally.  No signs of infections or ulcers noted.     Onychomycosis  Pain in right toes  Pain in left toes  Toe sprain fifth toe right foot.  Consent was obtained for treatment procedures.   Mechanical debridement of nails 1-5  bilaterally performed with a nail nipper.  Filed with dremel without incident   Return office visit  3 months                   Told patient to return for periodic foot care and evaluation due to potential at risk complications.  Visual inspection of feet was performed.     Shayla Heming DPM  

## 2020-12-23 ENCOUNTER — Ambulatory Visit: Payer: Medicare HMO | Admitting: Podiatry

## 2021-03-19 DIAGNOSIS — E785 Hyperlipidemia, unspecified: Secondary | ICD-10-CM | POA: Diagnosis not present

## 2021-03-19 DIAGNOSIS — I10 Essential (primary) hypertension: Secondary | ICD-10-CM | POA: Diagnosis not present

## 2021-03-19 DIAGNOSIS — E1169 Type 2 diabetes mellitus with other specified complication: Secondary | ICD-10-CM | POA: Diagnosis not present

## 2021-03-21 ENCOUNTER — Other Ambulatory Visit: Payer: Self-pay

## 2021-03-21 ENCOUNTER — Encounter: Payer: Self-pay | Admitting: Podiatry

## 2021-03-21 ENCOUNTER — Ambulatory Visit: Payer: Medicare HMO | Admitting: Podiatry

## 2021-03-21 DIAGNOSIS — M79676 Pain in unspecified toe(s): Secondary | ICD-10-CM

## 2021-03-21 DIAGNOSIS — B351 Tinea unguium: Secondary | ICD-10-CM

## 2021-03-21 DIAGNOSIS — E119 Type 2 diabetes mellitus without complications: Secondary | ICD-10-CM | POA: Diagnosis not present

## 2021-03-21 NOTE — Progress Notes (Signed)
This patient returns to my office for at risk foot care.  This patient requires this care by a professional since this patient will be at risk due to having type 2 diabetes.    This patient is unable to cut nails herself since the patient cannot reach her nails.These nails are painful walking and wearing shoes.  This patient presents for at risk foot care today.    General Appearance  Alert, conversant and in no acute stress.  Vascular  Dorsalis pedis and posterior tibial  pulses are not  palpable  bilaterally.  Capillary return is within normal limits  bilaterally. Temperature is within normal limits  Bilaterally.  Absent hair.  Neurologic  Senn-Weinstein monofilament wire test within normal limits  bilaterally. Muscle power within normal limits bilaterally.  Nails Thick disfigured discolored nails with subungual debris  from hallux to fifth toes bilaterally. No evidence of bacterial infection or drainage bilaterally.  Orthopedic  No limitations of motion  feet .  No crepitus or effusions noted.  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   Skin  normotropic skin with no porokeratosis noted bilaterally.  No signs of infections or ulcers noted.     Onychomycosis  Pain in right toes  Pain in left toes  Toe sprain fifth toe right foot.  Consent was obtained for treatment procedures.   Mechanical debridement of nails 1-5  bilaterally performed with a nail nipper.  Filed with dremel without incident   Return office visit  3 months                   Told patient to return for periodic foot care and evaluation due to potential at risk complications.  Visual inspection of feet was performed.     Trixy Loyola DPM  

## 2021-03-31 ENCOUNTER — Other Ambulatory Visit: Payer: Self-pay | Admitting: Family Medicine

## 2021-03-31 DIAGNOSIS — Z1231 Encounter for screening mammogram for malignant neoplasm of breast: Secondary | ICD-10-CM

## 2021-04-02 DIAGNOSIS — H35423 Microcystoid degeneration of retina, bilateral: Secondary | ICD-10-CM | POA: Diagnosis not present

## 2021-04-02 DIAGNOSIS — H353134 Nonexudative age-related macular degeneration, bilateral, advanced atrophic with subfoveal involvement: Secondary | ICD-10-CM | POA: Diagnosis not present

## 2021-04-02 DIAGNOSIS — H35372 Puckering of macula, left eye: Secondary | ICD-10-CM | POA: Diagnosis not present

## 2021-04-02 DIAGNOSIS — E113293 Type 2 diabetes mellitus with mild nonproliferative diabetic retinopathy without macular edema, bilateral: Secondary | ICD-10-CM | POA: Diagnosis not present

## 2021-05-02 ENCOUNTER — Ambulatory Visit
Admission: RE | Admit: 2021-05-02 | Discharge: 2021-05-02 | Disposition: A | Discharge status: Home / Self Care | Payer: Medicare HMO | Source: Ambulatory Visit | Attending: Family Medicine | Admitting: Family Medicine

## 2021-05-02 DIAGNOSIS — Z1231 Encounter for screening mammogram for malignant neoplasm of breast: Secondary | ICD-10-CM

## 2021-06-17 DIAGNOSIS — L729 Follicular cyst of the skin and subcutaneous tissue, unspecified: Secondary | ICD-10-CM | POA: Diagnosis not present

## 2021-06-17 DIAGNOSIS — I10 Essential (primary) hypertension: Secondary | ICD-10-CM | POA: Diagnosis not present

## 2021-06-17 DIAGNOSIS — Z7984 Long term (current) use of oral hypoglycemic drugs: Secondary | ICD-10-CM | POA: Diagnosis not present

## 2021-06-17 DIAGNOSIS — E1169 Type 2 diabetes mellitus with other specified complication: Secondary | ICD-10-CM | POA: Diagnosis not present

## 2021-06-17 DIAGNOSIS — E785 Hyperlipidemia, unspecified: Secondary | ICD-10-CM | POA: Diagnosis not present

## 2021-06-17 DIAGNOSIS — H9190 Unspecified hearing loss, unspecified ear: Secondary | ICD-10-CM | POA: Diagnosis not present

## 2021-06-19 DIAGNOSIS — E1169 Type 2 diabetes mellitus with other specified complication: Secondary | ICD-10-CM | POA: Diagnosis not present

## 2021-06-19 DIAGNOSIS — E785 Hyperlipidemia, unspecified: Secondary | ICD-10-CM | POA: Diagnosis not present

## 2021-06-25 ENCOUNTER — Ambulatory Visit: Payer: Medicare HMO | Admitting: Podiatry

## 2021-06-25 ENCOUNTER — Other Ambulatory Visit: Payer: Self-pay

## 2021-06-25 ENCOUNTER — Encounter: Payer: Self-pay | Admitting: Podiatry

## 2021-06-25 DIAGNOSIS — B351 Tinea unguium: Secondary | ICD-10-CM | POA: Diagnosis not present

## 2021-06-25 DIAGNOSIS — M79676 Pain in unspecified toe(s): Secondary | ICD-10-CM

## 2021-06-25 DIAGNOSIS — E119 Type 2 diabetes mellitus without complications: Secondary | ICD-10-CM | POA: Diagnosis not present

## 2021-06-25 DIAGNOSIS — E113293 Type 2 diabetes mellitus with mild nonproliferative diabetic retinopathy without macular edema, bilateral: Secondary | ICD-10-CM | POA: Insufficient documentation

## 2021-06-25 NOTE — Progress Notes (Signed)
This patient returns to my office for at risk foot care.  This patient requires this care by a professional since this patient will be at risk due to having type 2 diabetes.    This patient is unable to cut nails herself since the patient cannot reach her nails.These nails are painful walking and wearing shoes.  This patient presents for at risk foot care today.    General Appearance  Alert, conversant and in no acute stress.  Vascular  Dorsalis pedis and posterior tibial  pulses are not  palpable  bilaterally.  Capillary return is within normal limits  bilaterally. Temperature is within normal limits  Bilaterally.  Absent hair.  Neurologic  Senn-Weinstein monofilament wire test within normal limits  bilaterally. Muscle power within normal limits bilaterally.  Nails Thick disfigured discolored nails with subungual debris  from hallux to fifth toes bilaterally. No evidence of bacterial infection or drainage bilaterally.  Orthopedic  No limitations of motion  feet .  No crepitus or effusions noted.  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   Skin  normotropic skin with no porokeratosis noted bilaterally.  No signs of infections or ulcers noted.     Onychomycosis  Pain in right toes  Pain in left toes  Toe sprain fifth toe right foot.  Consent was obtained for treatment procedures.   Mechanical debridement of nails 1-5  bilaterally performed with a nail nipper.  Filed with dremel without incident   Return office visit  3 months                   Told patient to return for periodic foot care and evaluation due to potential at risk complications.  Visual inspection of feet was performed.     Arvis Miguez DPM  

## 2021-08-21 DIAGNOSIS — H93292 Other abnormal auditory perceptions, left ear: Secondary | ICD-10-CM | POA: Diagnosis not present

## 2021-09-03 IMAGING — MG DIGITAL SCREENING BILAT W/ TOMO W/ CAD
8 series · 8 of 24 positions shown · non-contrast
Comparison: Previous exam(s).

CLINICAL DATA: Screening.

EXAM:
DIGITAL SCREENING BILATERAL MAMMOGRAM WITH TOMO AND CAD

[L MLO synth-2D]
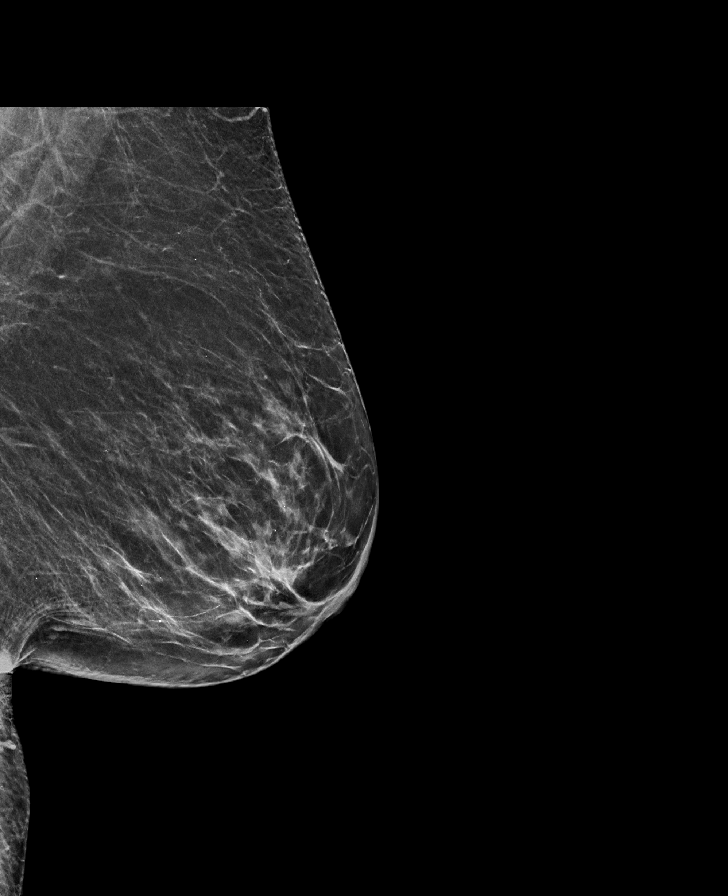

[R MLO synth-2D]
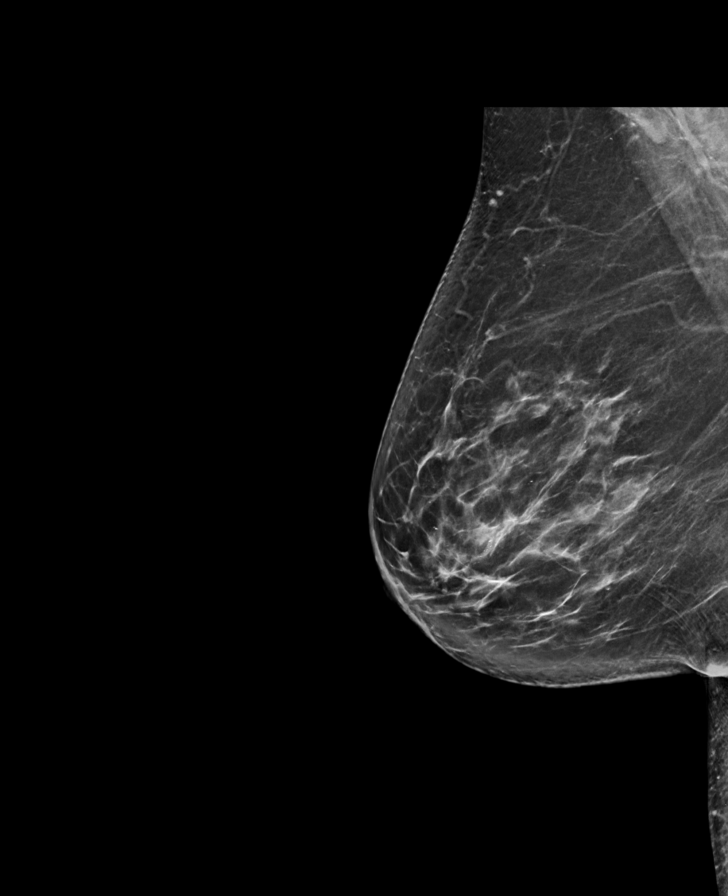

[R CC synth-2D]
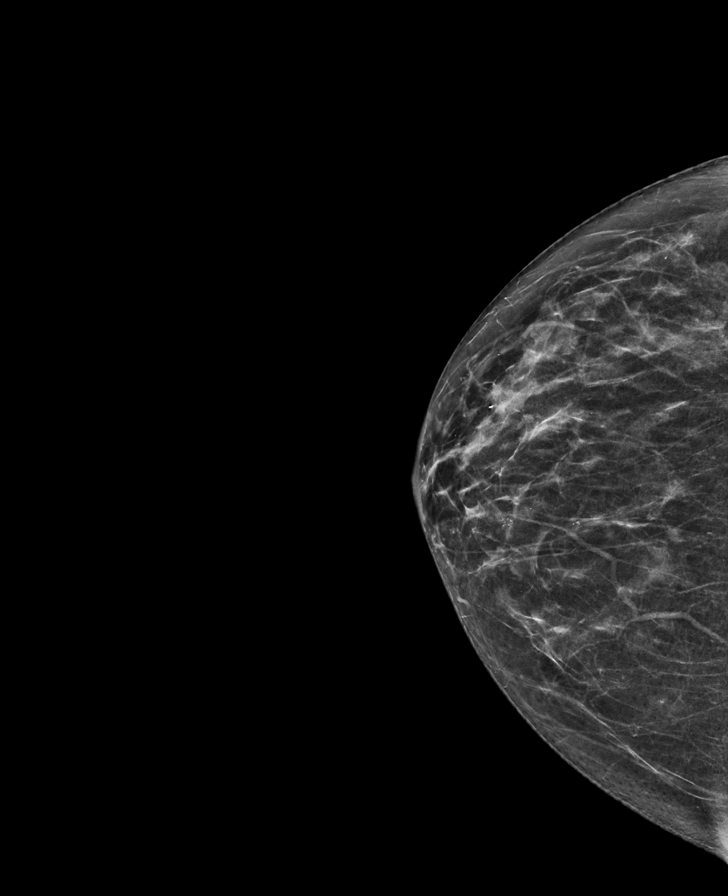

[L CC synth-2D]
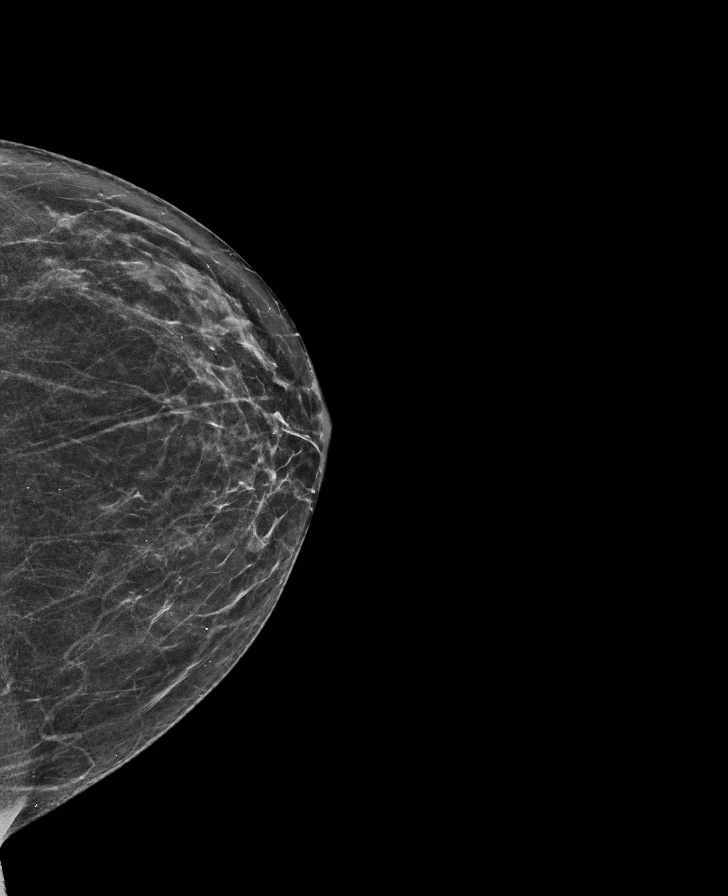

[L MLO tomo · tomo slice 37/72.0]
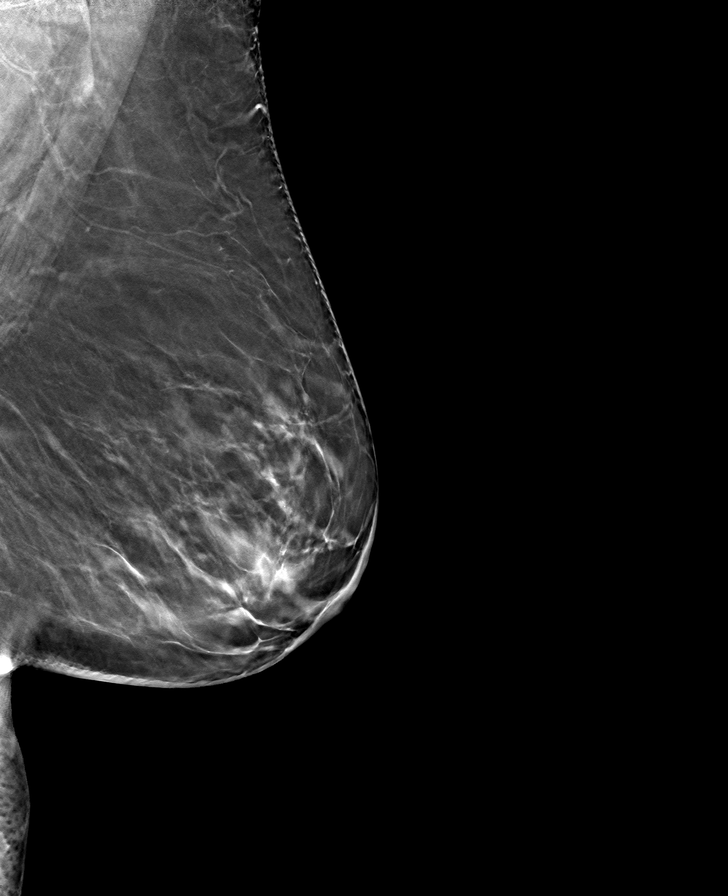

[R CC tomo · tomo slice 30/59.0]
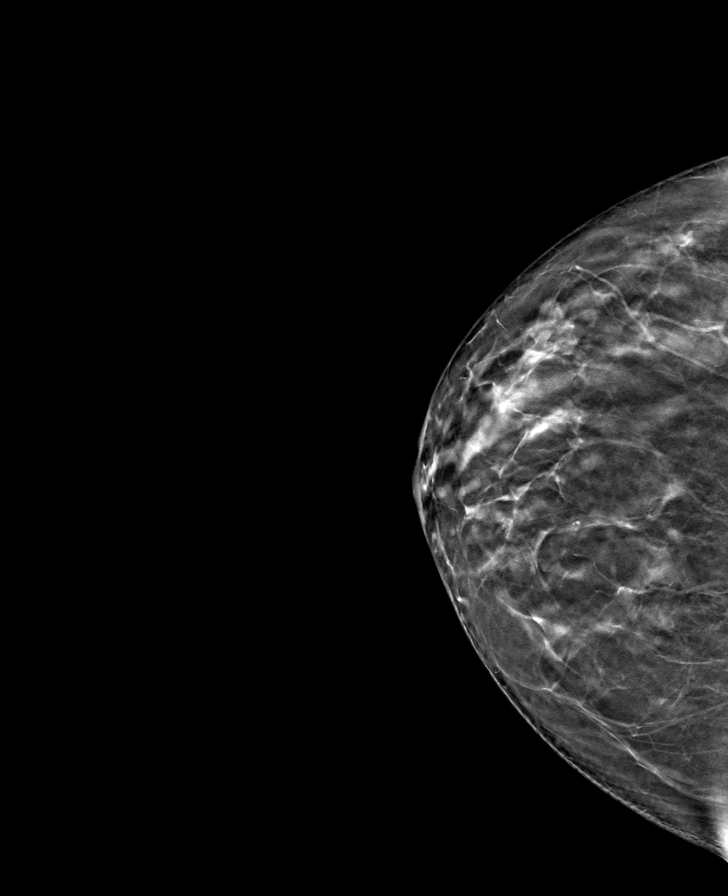

[L CC tomo · tomo slice 31/62.0]
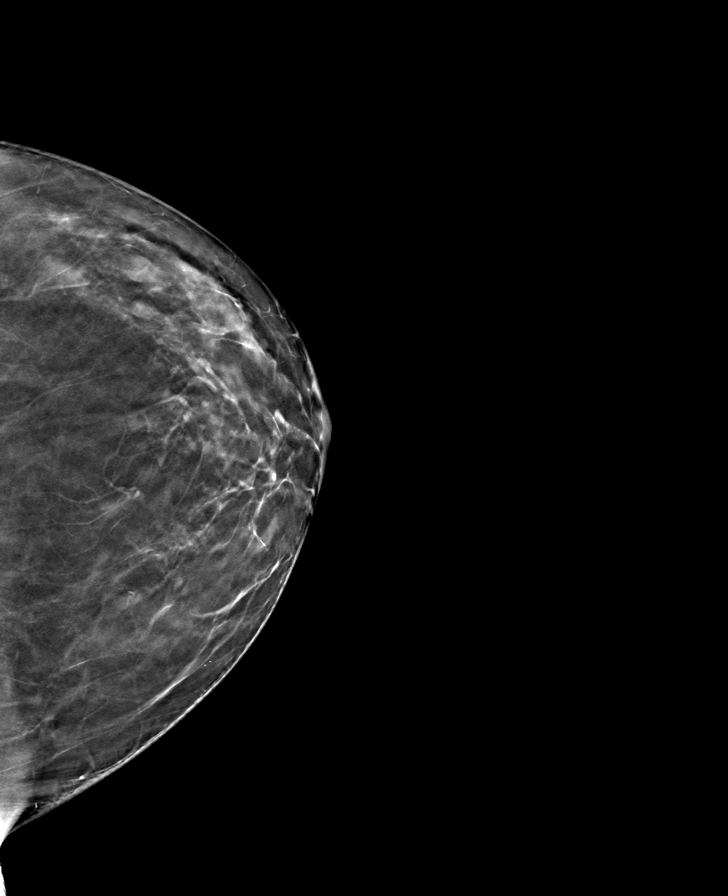

[R MLO tomo · tomo slice 41/81.0]
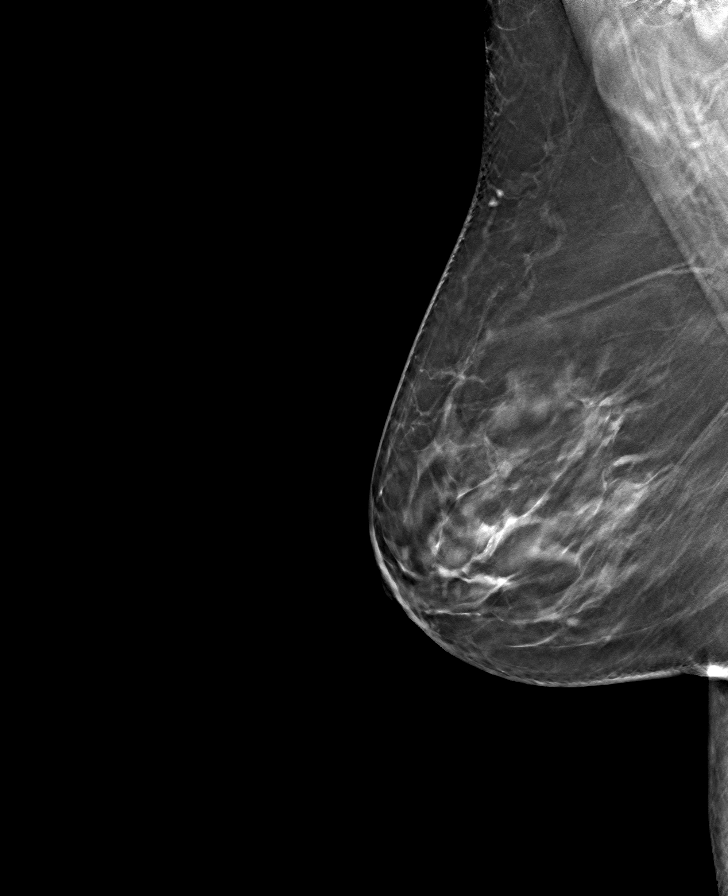

[8 of 24 positions shown; findings below may reference images not displayed]

ACR Breast Density Category b: There are scattered areas of
fibroglandular density.
FINDINGS: In the left breast, a possible asymmetry warrants further
evaluation. In the right breast, no findings suspicious for
malignancy. Images were processed with CAD.
IMPRESSION: Further evaluation is suggested for possible asymmetry in the left
breast.

RECOMMENDATION:
Diagnostic mammogram and possibly ultrasound of the left breast.
(Code:DI-5-LL4)

The patient will be contacted regarding the findings, and additional
imaging will be scheduled.

BI-RADS CATEGORY  0: Incomplete. Need additional imaging evaluation
and/or prior mammograms for comparison.

## 2021-09-08 DIAGNOSIS — E113293 Type 2 diabetes mellitus with mild nonproliferative diabetic retinopathy without macular edema, bilateral: Secondary | ICD-10-CM | POA: Diagnosis not present

## 2021-09-08 DIAGNOSIS — H04123 Dry eye syndrome of bilateral lacrimal glands: Secondary | ICD-10-CM | POA: Diagnosis not present

## 2021-09-08 DIAGNOSIS — H353134 Nonexudative age-related macular degeneration, bilateral, advanced atrophic with subfoveal involvement: Secondary | ICD-10-CM | POA: Diagnosis not present

## 2021-09-08 DIAGNOSIS — H0102B Squamous blepharitis left eye, upper and lower eyelids: Secondary | ICD-10-CM | POA: Diagnosis not present

## 2021-09-08 DIAGNOSIS — H0102A Squamous blepharitis right eye, upper and lower eyelids: Secondary | ICD-10-CM | POA: Diagnosis not present

## 2021-09-08 DIAGNOSIS — Z961 Presence of intraocular lens: Secondary | ICD-10-CM | POA: Diagnosis not present

## 2021-09-09 ENCOUNTER — Encounter: Payer: Self-pay | Admitting: Plastic Surgery

## 2021-09-09 ENCOUNTER — Ambulatory Visit: Payer: Medicare HMO | Admitting: Plastic Surgery

## 2021-09-09 VITALS — BP 137/74 | HR 78 | Ht <= 58 in | Wt 148.0 lb

## 2021-09-09 DIAGNOSIS — L988 Other specified disorders of the skin and subcutaneous tissue: Secondary | ICD-10-CM | POA: Diagnosis not present

## 2021-09-09 DIAGNOSIS — E1169 Type 2 diabetes mellitus with other specified complication: Secondary | ICD-10-CM | POA: Diagnosis not present

## 2021-09-09 DIAGNOSIS — L723 Sebaceous cyst: Secondary | ICD-10-CM

## 2021-09-09 DIAGNOSIS — L989 Disorder of the skin and subcutaneous tissue, unspecified: Secondary | ICD-10-CM | POA: Insufficient documentation

## 2021-09-09 NOTE — Progress Notes (Signed)
? ?  Patient ID: Isabel Weeks, female    DOB: 1951/02/12, 71 y.o.   MRN: [id number] ? ? ?Chief Complaint  ?Patient presents with  ? Consult  ? ? ?The patient is a 71 year old female here for evaluation of several areas of her skin.  She has a bump between her breasts.  She states its been getting larger and been there for over a year.  It appears to be a sebaceous cyst.  It is approximately 1.5 cm.  It does not appear infected.  She has 1 on her back on the left side that is very similar.  It is 1.5 cm.  It appears to be a sebaceous cyst and does not appear to be infected.  It has also been getting larger.  She also has 2 lesions on her upper middle back one at the base of her scalp that is 3 to 4 mm in size and is raised.  Its been getting larger and seems to be aggravating her.  It is flesh-colored.  Then there is 1 slightly lower to that that was partially excised in the past.  It looks like it may be scar tissue and a hypertrophic scar.  It is slightly red and 4 x 10 mm in size.  She does have diabetes.  The patient was not pleased with dermatology however I think it may be part of how she heals and not the way things are being removed.  I tried to express this to her because I do not want her to be upset with me if she heals in the same way.  She has had trouble healing in the past and had to be on antibiotics when she had surgery on her left lower extremity. ? ? ?Review of Systems  ?Constitutional: Negative.   ?HENT: Negative.    ?Eyes: Negative.   ?Respiratory: Negative.  Negative for chest tightness and shortness of breath.   ?Cardiovascular: Negative.  Negative for leg swelling.  ?Gastrointestinal: Negative.   ?Endocrine: Negative.   ?Genitourinary: Negative.   ? ?History reviewed. No pertinent past medical history.  ?History reviewed. No pertinent surgical history.  ? ? ?Current Outpatient Medications:  ?  acarbose (PRECOSE) 100 MG tablet, TAKE 1 TABLET 3 TIMES A DAY WITH MEALS, Disp: , Rfl:  ?   acarbose (PRECOSE) 100 MG tablet, Take 1 tablet by mouth 2 (two) times daily., Disp: , Rfl:  ?  amLODipine (NORVASC) 5 MG tablet, , Disp: , Rfl:  ?  Aspirin Buf,CaCarb-MgCarb-MgO, 81 MG TABS, Take by mouth., Disp: , Rfl:  ?  aspirin EC 81 MG tablet, Take by mouth., Disp: , Rfl:  ?  atorvastatin (LIPITOR) 10 MG tablet, Take 10 mg by mouth daily., Disp: , Rfl:  ?  atorvastatin (LIPITOR) 10 MG tablet, Take 1 tablet by mouth daily., Disp: , Rfl:  ?  b complex vitamins capsule, Take by mouth., Disp: , Rfl:  ?  BESIVANCE 0.6 % SUSP, Place 1 drop into the left eye 3 (three) times daily., Disp: , Rfl:  ?  calcium carbonate (OSCAL) 1500 (600 Ca) MG TABS tablet, 1 tablet, Disp: , Rfl:  ?  Cholecalciferol 25 MCG (1000 UT) tablet, Take by mouth., Disp: , Rfl:  ?  Cinnamon 500 MG capsule, Take 1,000 mg by mouth., Disp: , Rfl:  ?  DUREZOL 0.05 % EMUL, Place 1 drop into the left eye 3 (three) times daily., Disp: , Rfl:  ?  Magnesium 250 MG TABS, 1 tablet, Disp: ,  Rfl:  ?  metFORMIN (GLUCOPHAGE) 500 MG tablet, TAKE 2 TABLETS BY MOUTH TWICE DAILY WITH MEALS, Disp: , Rfl:  ?  Multiple Vitamins-Minerals (PRESERVISION/LUTEIN PO), Take by mouth., Disp: , Rfl:  ?  olmesartan (BENICAR) 20 MG tablet, 1 tablet, Disp: , Rfl:  ?  olmesartan (BENICAR) 20 MG tablet, Take 1 tablet by mouth daily., Disp: , Rfl:  ?  Olmesartan Medoxomil-HCTZ (BENICAR HCT PO), Take by mouth., Disp: , Rfl:  ?  Omega 3 1000 MG CAPS, 1 capsule, Disp: , Rfl:  ?  PROLENSA 0.07 % SOLN, Place 1 drop into the left eye daily., Disp: , Rfl:  ?  REFRESH PLUS 0.5 % SOLN, Apply 1 drop to eye 2 (two) times daily., Disp: , Rfl:  ?  Terbinafine HCl (LAMISIL PO), Take by mouth., Disp: , Rfl:  ?  UNABLE TO FIND, Take by mouth. CALCIUM CARBONATE/VITAMIN D3 (CALCARB 600 WITH VITAMIN D ORAL), Disp: , Rfl:  ?  UNABLE TO FIND, Take by mouth. FERROUS FUMARATE/VIT BCOMP&C (SUPER B COMPLEX ORAL), Disp: , Rfl:  ?  UNABLE TO FIND, Take by mouth. MAGNESIUM OXIDE/MAG AA CHELATE (MAGNESIUM,  OXIDE/AA CHELATE, ORAL),'250mg'$ , Disp: , Rfl:   ? ?Objective:  ? ?Vitals:  ? 09/09/21 0818  ?BP: 137/74  ?Pulse: 78  ?SpO2: 96%  ? ? ?Physical Exam ?Vitals reviewed.  ?Constitutional:   ?   Appearance: Normal appearance.  ?HENT:  ?   Head: Normocephalic and atraumatic.  ?Cardiovascular:  ?   Rate and Rhythm: Normal rate.  ?Pulmonary:  ?   Effort: Pulmonary effort is normal.  ?Skin: ?   General: Skin is warm.  ?   Coloration: Skin is not jaundiced.  ?   Findings: Lesion present.  ?Neurological:  ?   Mental Status: She is alert and oriented to person, place, and time.  ?Psychiatric:     ?   Mood and Affect: Mood normal.     ?   Behavior: Behavior normal.     ?   Thought Content: Thought content normal.  ? ? ?Assessment & Plan:  ?Type 2 diabetes mellitus with other specified complication, unspecified whether long term insulin use ([place]) ? ?Sebaceous cyst ? ?Changing skin lesion ? ?Discussed options with patient for excision of all 4 areas.  This would include the sebaceous cyst of breast area and left back.  This would also include the 2 lesions on her back.  We can get this arranged to do in the office.  I stressed to her that she may heal the same way as she has in the past. ? ?Pictures were obtained of the patient and placed in the chart with the patient's or guardian's permission. ? ? ?Loel Lofty Carla Whilden, DO ?

## 2021-09-17 DIAGNOSIS — E785 Hyperlipidemia, unspecified: Secondary | ICD-10-CM | POA: Diagnosis not present

## 2021-09-17 DIAGNOSIS — I10 Essential (primary) hypertension: Secondary | ICD-10-CM | POA: Diagnosis not present

## 2021-09-17 DIAGNOSIS — Z5181 Encounter for therapeutic drug level monitoring: Secondary | ICD-10-CM | POA: Diagnosis not present

## 2021-09-17 DIAGNOSIS — E1169 Type 2 diabetes mellitus with other specified complication: Secondary | ICD-10-CM | POA: Diagnosis not present

## 2021-09-19 DIAGNOSIS — Z85828 Personal history of other malignant neoplasm of skin: Secondary | ICD-10-CM | POA: Diagnosis not present

## 2021-09-19 DIAGNOSIS — C44311 Basal cell carcinoma of skin of nose: Secondary | ICD-10-CM | POA: Diagnosis not present

## 2021-09-19 DIAGNOSIS — D2271 Melanocytic nevi of right lower limb, including hip: Secondary | ICD-10-CM | POA: Diagnosis not present

## 2021-09-19 DIAGNOSIS — D2262 Melanocytic nevi of left upper limb, including shoulder: Secondary | ICD-10-CM | POA: Diagnosis not present

## 2021-09-19 DIAGNOSIS — D224 Melanocytic nevi of scalp and neck: Secondary | ICD-10-CM | POA: Diagnosis not present

## 2021-09-19 DIAGNOSIS — D2272 Melanocytic nevi of left lower limb, including hip: Secondary | ICD-10-CM | POA: Diagnosis not present

## 2021-09-19 DIAGNOSIS — D485 Neoplasm of uncertain behavior of skin: Secondary | ICD-10-CM | POA: Diagnosis not present

## 2021-09-19 DIAGNOSIS — D225 Melanocytic nevi of trunk: Secondary | ICD-10-CM | POA: Diagnosis not present

## 2021-09-19 DIAGNOSIS — C44619 Basal cell carcinoma of skin of left upper limb, including shoulder: Secondary | ICD-10-CM | POA: Diagnosis not present

## 2021-09-19 DIAGNOSIS — L720 Epidermal cyst: Secondary | ICD-10-CM | POA: Diagnosis not present

## 2021-09-26 ENCOUNTER — Ambulatory Visit: Payer: Medicare HMO | Admitting: Podiatry

## 2021-09-26 ENCOUNTER — Encounter: Payer: Self-pay | Admitting: Podiatry

## 2021-09-26 DIAGNOSIS — M79676 Pain in unspecified toe(s): Secondary | ICD-10-CM | POA: Diagnosis not present

## 2021-09-26 DIAGNOSIS — B351 Tinea unguium: Secondary | ICD-10-CM | POA: Diagnosis not present

## 2021-09-26 DIAGNOSIS — E119 Type 2 diabetes mellitus without complications: Secondary | ICD-10-CM

## 2021-09-26 NOTE — Progress Notes (Signed)
This patient returns to my office for at risk foot care.  This patient requires this care by a professional since this patient will be at risk due to having type 2 diabetes.    This patient is unable to cut nails herself since the patient cannot reach her nails.These nails are painful walking and wearing shoes.  This patient presents for at risk foot care today.    General Appearance  Alert, conversant and in no acute stress.  Vascular  Dorsalis pedis and posterior tibial  pulses are not  palpable  bilaterally.  Capillary return is within normal limits  bilaterally. Temperature is within normal limits  Bilaterally.  Absent hair.  Neurologic  Senn-Weinstein monofilament wire test within normal limits  bilaterally. Muscle power within normal limits bilaterally.  Nails Thick disfigured discolored nails with subungual debris  from hallux to fifth toes bilaterally. No evidence of bacterial infection or drainage bilaterally.  Orthopedic  No limitations of motion  feet .  No crepitus or effusions noted.  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   Skin  normotropic skin with no porokeratosis noted bilaterally.  No signs of infections or ulcers noted.     Onychomycosis  Pain in right toes  Pain in left toes  Toe sprain fifth toe right foot.  Consent was obtained for treatment procedures.   Mechanical debridement of nails 1-5  bilaterally performed with a nail nipper.  Filed with dremel without incident   Return office visit  3 months                   Told patient to return for periodic foot care and evaluation due to potential at risk complications.  Visual inspection of feet was performed.     Prakash Kimberling DPM  

## 2021-10-01 DIAGNOSIS — H353134 Nonexudative age-related macular degeneration, bilateral, advanced atrophic with subfoveal involvement: Secondary | ICD-10-CM | POA: Diagnosis not present

## 2021-10-01 DIAGNOSIS — H43393 Other vitreous opacities, bilateral: Secondary | ICD-10-CM | POA: Diagnosis not present

## 2021-10-01 DIAGNOSIS — E113293 Type 2 diabetes mellitus with mild nonproliferative diabetic retinopathy without macular edema, bilateral: Secondary | ICD-10-CM | POA: Diagnosis not present

## 2021-10-01 DIAGNOSIS — H43813 Vitreous degeneration, bilateral: Secondary | ICD-10-CM | POA: Diagnosis not present

## 2021-10-07 ENCOUNTER — Ambulatory Visit: Payer: Medicare HMO | Admitting: Plastic Surgery

## 2021-10-20 DIAGNOSIS — H90A32 Mixed conductive and sensorineural hearing loss, unilateral, left ear with restricted hearing on the contralateral side: Secondary | ICD-10-CM | POA: Diagnosis not present

## 2021-10-20 DIAGNOSIS — H90A21 Sensorineural hearing loss, unilateral, right ear, with restricted hearing on the contralateral side: Secondary | ICD-10-CM | POA: Diagnosis not present

## 2021-10-27 DIAGNOSIS — C44311 Basal cell carcinoma of skin of nose: Secondary | ICD-10-CM | POA: Diagnosis not present

## 2021-12-16 DIAGNOSIS — E1169 Type 2 diabetes mellitus with other specified complication: Secondary | ICD-10-CM | POA: Diagnosis not present

## 2021-12-16 DIAGNOSIS — Z0000 Encounter for general adult medical examination without abnormal findings: Secondary | ICD-10-CM | POA: Diagnosis not present

## 2021-12-16 DIAGNOSIS — I10 Essential (primary) hypertension: Secondary | ICD-10-CM | POA: Diagnosis not present

## 2021-12-16 DIAGNOSIS — H3530 Unspecified macular degeneration: Secondary | ICD-10-CM | POA: Diagnosis not present

## 2021-12-16 DIAGNOSIS — E785 Hyperlipidemia, unspecified: Secondary | ICD-10-CM | POA: Diagnosis not present

## 2021-12-19 ENCOUNTER — Encounter: Payer: Self-pay | Admitting: Podiatry

## 2021-12-19 ENCOUNTER — Ambulatory Visit: Payer: Medicare HMO | Admitting: Podiatry

## 2021-12-19 DIAGNOSIS — E119 Type 2 diabetes mellitus without complications: Secondary | ICD-10-CM | POA: Diagnosis not present

## 2021-12-19 DIAGNOSIS — B351 Tinea unguium: Secondary | ICD-10-CM

## 2021-12-19 DIAGNOSIS — M79676 Pain in unspecified toe(s): Secondary | ICD-10-CM

## 2021-12-19 NOTE — Progress Notes (Signed)
This patient returns to my office for at risk foot care.  This patient requires this care by a professional since this patient will be at risk due to having type 2 diabetes.    This patient is unable to cut nails herself since the patient cannot reach her nails.These nails are painful walking and wearing shoes.  This patient presents for at risk foot care today.    General Appearance  Alert, conversant and in no acute stress.  Vascular  Dorsalis pedis and posterior tibial  pulses are not  palpable  bilaterally.  Capillary return is within normal limits  bilaterally. Temperature is within normal limits  Bilaterally.  Absent hair.  Neurologic  Senn-Weinstein monofilament wire test within normal limits  bilaterally. Muscle power within normal limits bilaterally.  Nails Thick disfigured discolored nails with subungual debris  from hallux to fifth toes bilaterally. No evidence of bacterial infection or drainage bilaterally.  Orthopedic  No limitations of motion  feet .  No crepitus or effusions noted.  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   Skin  normotropic skin with no porokeratosis noted bilaterally.  No signs of infections or ulcers noted.     Onychomycosis  Pain in right toes  Pain in left toes  Toe sprain fifth toe right foot.  Consent was obtained for treatment procedures.   Mechanical debridement of nails 1-5  bilaterally performed with a nail nipper.  Filed with dremel without incident   Return office visit  3 months                   Told patient to return for periodic foot care and evaluation due to potential at risk complications.  Visual inspection of feet was performed.  Patient injured lateral aspect 4th toenail left foot which is healing.   Kimyetta Flott DPM  

## 2022-02-10 DIAGNOSIS — H6121 Impacted cerumen, right ear: Secondary | ICD-10-CM | POA: Diagnosis not present

## 2022-02-17 ENCOUNTER — Ambulatory Visit: Payer: Medicare HMO | Admitting: Plastic Surgery

## 2022-03-18 DIAGNOSIS — E785 Hyperlipidemia, unspecified: Secondary | ICD-10-CM | POA: Diagnosis not present

## 2022-03-18 DIAGNOSIS — E1169 Type 2 diabetes mellitus with other specified complication: Secondary | ICD-10-CM | POA: Diagnosis not present

## 2022-03-18 DIAGNOSIS — R946 Abnormal results of thyroid function studies: Secondary | ICD-10-CM | POA: Diagnosis not present

## 2022-03-18 DIAGNOSIS — I10 Essential (primary) hypertension: Secondary | ICD-10-CM | POA: Diagnosis not present

## 2022-03-23 ENCOUNTER — Other Ambulatory Visit: Payer: Self-pay | Admitting: Family Medicine

## 2022-03-23 DIAGNOSIS — Z1231 Encounter for screening mammogram for malignant neoplasm of breast: Secondary | ICD-10-CM

## 2022-03-30 ENCOUNTER — Encounter: Payer: Self-pay | Admitting: Podiatry

## 2022-03-30 ENCOUNTER — Ambulatory Visit: Payer: Medicare HMO | Admitting: Podiatry

## 2022-03-30 DIAGNOSIS — E119 Type 2 diabetes mellitus without complications: Secondary | ICD-10-CM

## 2022-03-30 DIAGNOSIS — B351 Tinea unguium: Secondary | ICD-10-CM | POA: Diagnosis not present

## 2022-03-30 DIAGNOSIS — M79676 Pain in unspecified toe(s): Secondary | ICD-10-CM

## 2022-03-30 NOTE — Progress Notes (Signed)
This patient returns to my office for at risk foot care.  This patient requires this care by a professional since this patient will be at risk due to having type 2 diabetes.    This patient is unable to cut nails herself since the patient cannot reach her nails.These nails are painful walking and wearing shoes.  This patient presents for at risk foot care today.    General Appearance  Alert, conversant and in no acute stress.  Vascular  Dorsalis pedis and posterior tibial  pulses are not  palpable  bilaterally.  Capillary return is within normal limits  bilaterally. Temperature is within normal limits  Bilaterally.  Absent hair.  Neurologic  Senn-Weinstein monofilament wire test within normal limits  bilaterally. Muscle power within normal limits bilaterally.  Nails Thick disfigured discolored nails with subungual debris  from hallux to fifth toes bilaterally. No evidence of bacterial infection or drainage bilaterally.  Orthopedic  No limitations of motion  feet .  No crepitus or effusions noted.  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   Skin  normotropic skin with no porokeratosis noted bilaterally.  No signs of infections or ulcers noted.     Onychomycosis  Pain in right toes  Pain in left toes  Toe sprain fifth toe right foot.  Consent was obtained for treatment procedures.   Mechanical debridement of nails 1-5  bilaterally performed with a nail nipper.  Filed with dremel without incident   Return office visit  3 months                   Told patient to return for periodic foot care and evaluation due to potential at risk complications.  Visual inspection of feet was performed.  Patient injured lateral aspect 4th toenail left foot which is healing.   Gardiner Barefoot DPM

## 2022-04-17 DIAGNOSIS — E119 Type 2 diabetes mellitus without complications: Secondary | ICD-10-CM | POA: Diagnosis not present

## 2022-04-17 DIAGNOSIS — Z85828 Personal history of other malignant neoplasm of skin: Secondary | ICD-10-CM | POA: Diagnosis not present

## 2022-04-17 DIAGNOSIS — Z6831 Body mass index (BMI) 31.0-31.9, adult: Secondary | ICD-10-CM | POA: Diagnosis not present

## 2022-04-17 DIAGNOSIS — Z7984 Long term (current) use of oral hypoglycemic drugs: Secondary | ICD-10-CM | POA: Diagnosis not present

## 2022-04-17 DIAGNOSIS — Z888 Allergy status to other drugs, medicaments and biological substances status: Secondary | ICD-10-CM | POA: Diagnosis not present

## 2022-04-17 DIAGNOSIS — Z823 Family history of stroke: Secondary | ICD-10-CM | POA: Diagnosis not present

## 2022-04-17 DIAGNOSIS — E669 Obesity, unspecified: Secondary | ICD-10-CM | POA: Diagnosis not present

## 2022-04-17 DIAGNOSIS — I10 Essential (primary) hypertension: Secondary | ICD-10-CM | POA: Diagnosis not present

## 2022-04-17 DIAGNOSIS — E785 Hyperlipidemia, unspecified: Secondary | ICD-10-CM | POA: Diagnosis not present

## 2022-04-17 DIAGNOSIS — Z882 Allergy status to sulfonamides status: Secondary | ICD-10-CM | POA: Diagnosis not present

## 2022-05-19 ENCOUNTER — Ambulatory Visit
Admission: RE | Admit: 2022-05-19 | Discharge: 2022-05-19 | Disposition: A | Discharge status: Home / Self Care | Payer: Medicare HMO | Source: Ambulatory Visit | Attending: Family Medicine | Admitting: Family Medicine

## 2022-05-19 DIAGNOSIS — Z1231 Encounter for screening mammogram for malignant neoplasm of breast: Secondary | ICD-10-CM

## 2022-05-20 DIAGNOSIS — H43813 Vitreous degeneration, bilateral: Secondary | ICD-10-CM | POA: Diagnosis not present

## 2022-05-20 DIAGNOSIS — E113293 Type 2 diabetes mellitus with mild nonproliferative diabetic retinopathy without macular edema, bilateral: Secondary | ICD-10-CM | POA: Diagnosis not present

## 2022-05-20 DIAGNOSIS — H35372 Puckering of macula, left eye: Secondary | ICD-10-CM | POA: Diagnosis not present

## 2022-05-27 DIAGNOSIS — H353130 Nonexudative age-related macular degeneration, bilateral, stage unspecified: Secondary | ICD-10-CM | POA: Diagnosis not present

## 2022-06-03 DIAGNOSIS — L814 Other melanin hyperpigmentation: Secondary | ICD-10-CM | POA: Diagnosis not present

## 2022-06-03 DIAGNOSIS — L821 Other seborrheic keratosis: Secondary | ICD-10-CM | POA: Diagnosis not present

## 2022-06-03 DIAGNOSIS — D225 Melanocytic nevi of trunk: Secondary | ICD-10-CM | POA: Diagnosis not present

## 2022-06-03 DIAGNOSIS — Z08 Encounter for follow-up examination after completed treatment for malignant neoplasm: Secondary | ICD-10-CM | POA: Diagnosis not present

## 2022-06-03 DIAGNOSIS — L910 Hypertrophic scar: Secondary | ICD-10-CM | POA: Diagnosis not present

## 2022-06-03 DIAGNOSIS — Z85828 Personal history of other malignant neoplasm of skin: Secondary | ICD-10-CM | POA: Diagnosis not present

## 2022-06-23 DIAGNOSIS — H3530 Unspecified macular degeneration: Secondary | ICD-10-CM | POA: Diagnosis not present

## 2022-06-23 DIAGNOSIS — E1165 Type 2 diabetes mellitus with hyperglycemia: Secondary | ICD-10-CM | POA: Diagnosis not present

## 2022-06-23 DIAGNOSIS — E1169 Type 2 diabetes mellitus with other specified complication: Secondary | ICD-10-CM | POA: Diagnosis not present

## 2022-06-23 DIAGNOSIS — I10 Essential (primary) hypertension: Secondary | ICD-10-CM | POA: Diagnosis not present

## 2022-06-23 DIAGNOSIS — E113293 Type 2 diabetes mellitus with mild nonproliferative diabetic retinopathy without macular edema, bilateral: Secondary | ICD-10-CM | POA: Diagnosis not present

## 2022-06-29 ENCOUNTER — Ambulatory Visit: Payer: Medicare HMO | Admitting: Podiatry

## 2022-06-29 ENCOUNTER — Encounter: Payer: Self-pay | Admitting: Podiatry

## 2022-06-29 DIAGNOSIS — B351 Tinea unguium: Secondary | ICD-10-CM

## 2022-06-29 DIAGNOSIS — E119 Type 2 diabetes mellitus without complications: Secondary | ICD-10-CM | POA: Diagnosis not present

## 2022-06-29 DIAGNOSIS — M79676 Pain in unspecified toe(s): Secondary | ICD-10-CM | POA: Diagnosis not present

## 2022-06-29 NOTE — Progress Notes (Signed)
This patient returns to my office for at risk foot care.  This patient requires this care by a professional since this patient will be at risk due to having type 2 diabetes.    This patient is unable to cut nails herself since the patient cannot reach her nails.These nails are painful walking and wearing shoes.  This patient presents for at risk foot care today.    General Appearance  Alert, conversant and in no acute stress.  Vascular  Dorsalis pedis and posterior tibial  pulses are not  palpable  bilaterally.  Capillary return is within normal limits  bilaterally. Temperature is within normal limits  Bilaterally.  Absent hair.  Neurologic  Senn-Weinstein monofilament wire test within normal limits  bilaterally. Muscle power within normal limits bilaterally.  Nails Thick disfigured discolored nails with subungual debris  from hallux to fifth toes bilaterally. No evidence of bacterial infection or drainage bilaterally.  Orthopedic  No limitations of motion  feet .  No crepitus or effusions noted.  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   Skin  normotropic skin with no porokeratosis noted bilaterally.  No signs of infections or ulcers noted.     Onychomycosis  Pain in right toes  Pain in left toes  Toe sprain fifth toe right foot.  Consent was obtained for treatment procedures.   Mechanical debridement of nails 1-5  bilaterally performed with a nail nipper.  Filed with dremel without incident   Return office visit  3 months                   Told patient to return for periodic foot care and evaluation due to potential at risk complications.  Visual inspection of feet was performed.     Gardiner Barefoot DPM

## 2022-07-24 DIAGNOSIS — H35372 Puckering of macula, left eye: Secondary | ICD-10-CM | POA: Diagnosis not present

## 2022-07-24 DIAGNOSIS — E113293 Type 2 diabetes mellitus with mild nonproliferative diabetic retinopathy without macular edema, bilateral: Secondary | ICD-10-CM | POA: Diagnosis not present

## 2022-07-24 DIAGNOSIS — H353130 Nonexudative age-related macular degeneration, bilateral, stage unspecified: Secondary | ICD-10-CM | POA: Diagnosis not present

## 2022-07-24 DIAGNOSIS — H43813 Vitreous degeneration, bilateral: Secondary | ICD-10-CM | POA: Diagnosis not present

## 2022-08-14 DIAGNOSIS — H6123 Impacted cerumen, bilateral: Secondary | ICD-10-CM | POA: Diagnosis not present

## 2022-08-14 DIAGNOSIS — Z974 Presence of external hearing-aid: Secondary | ICD-10-CM | POA: Diagnosis not present

## 2022-09-11 DIAGNOSIS — H0102B Squamous blepharitis left eye, upper and lower eyelids: Secondary | ICD-10-CM | POA: Diagnosis not present

## 2022-09-11 DIAGNOSIS — H353134 Nonexudative age-related macular degeneration, bilateral, advanced atrophic with subfoveal involvement: Secondary | ICD-10-CM | POA: Diagnosis not present

## 2022-09-11 DIAGNOSIS — H04123 Dry eye syndrome of bilateral lacrimal glands: Secondary | ICD-10-CM | POA: Diagnosis not present

## 2022-09-11 DIAGNOSIS — E113293 Type 2 diabetes mellitus with mild nonproliferative diabetic retinopathy without macular edema, bilateral: Secondary | ICD-10-CM | POA: Diagnosis not present

## 2022-09-11 DIAGNOSIS — Z961 Presence of intraocular lens: Secondary | ICD-10-CM | POA: Diagnosis not present

## 2022-09-11 DIAGNOSIS — H0102A Squamous blepharitis right eye, upper and lower eyelids: Secondary | ICD-10-CM | POA: Diagnosis not present

## 2022-09-22 DIAGNOSIS — E113293 Type 2 diabetes mellitus with mild nonproliferative diabetic retinopathy without macular edema, bilateral: Secondary | ICD-10-CM | POA: Diagnosis not present

## 2022-09-22 DIAGNOSIS — H353130 Nonexudative age-related macular degeneration, bilateral, stage unspecified: Secondary | ICD-10-CM | POA: Diagnosis not present

## 2022-09-22 DIAGNOSIS — H43813 Vitreous degeneration, bilateral: Secondary | ICD-10-CM | POA: Diagnosis not present

## 2022-09-22 DIAGNOSIS — H35372 Puckering of macula, left eye: Secondary | ICD-10-CM | POA: Diagnosis not present

## 2022-09-25 DIAGNOSIS — I10 Essential (primary) hypertension: Secondary | ICD-10-CM | POA: Diagnosis not present

## 2022-09-25 DIAGNOSIS — H3530 Unspecified macular degeneration: Secondary | ICD-10-CM | POA: Diagnosis not present

## 2022-09-25 DIAGNOSIS — E785 Hyperlipidemia, unspecified: Secondary | ICD-10-CM | POA: Diagnosis not present

## 2022-09-25 DIAGNOSIS — E1169 Type 2 diabetes mellitus with other specified complication: Secondary | ICD-10-CM | POA: Diagnosis not present

## 2022-09-25 DIAGNOSIS — E038 Other specified hypothyroidism: Secondary | ICD-10-CM | POA: Diagnosis not present

## 2022-10-05 ENCOUNTER — Encounter: Payer: Self-pay | Admitting: Podiatry

## 2022-10-05 ENCOUNTER — Ambulatory Visit: Payer: Medicare HMO | Admitting: Podiatry

## 2022-10-05 DIAGNOSIS — M79676 Pain in unspecified toe(s): Secondary | ICD-10-CM | POA: Diagnosis not present

## 2022-10-05 DIAGNOSIS — B351 Tinea unguium: Secondary | ICD-10-CM | POA: Diagnosis not present

## 2022-10-05 DIAGNOSIS — E119 Type 2 diabetes mellitus without complications: Secondary | ICD-10-CM | POA: Diagnosis not present

## 2022-10-05 NOTE — Progress Notes (Signed)
This patient returns to my office for at risk foot care.  This patient requires this care by a professional since this patient will be at risk due to having type 2 diabetes.    This patient is unable to cut nails herself since the patient cannot reach her nails.These nails are painful walking and wearing shoes.  This patient presents for at risk foot care today.    General Appearance  Alert, conversant and in no acute stress.  Vascular  Dorsalis pedis and posterior tibial  pulses are not  palpable  bilaterally.  Capillary return is within normal limits  bilaterally. Temperature is within normal limits  Bilaterally.  Absent hair.  Neurologic  Senn-Weinstein monofilament wire test within normal limits  bilaterally. Muscle power within normal limits bilaterally.  Nails Thick disfigured discolored nails with subungual debris  from hallux to fifth toes bilaterally. No evidence of bacterial infection or drainage bilaterally.  Orthopedic  No limitations of motion  feet .  No crepitus or effusions noted.  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   Skin  normotropic skin with no porokeratosis noted bilaterally.  No signs of infections or ulcers noted.     Onychomycosis  Pain in right toes  Pain in left toes  Toe sprain fifth toe right foot.  Consent was obtained for treatment procedures.   Mechanical debridement of nails 1-5  bilaterally performed with a nail nipper.  Filed with dremel without incident   Return office visit  3 months                   Told patient to return for periodic foot care and evaluation due to potential at risk complications.  Visual inspection of feet was performed.   Felix Pratt DPM  

## 2022-11-17 DIAGNOSIS — E113293 Type 2 diabetes mellitus with mild nonproliferative diabetic retinopathy without macular edema, bilateral: Secondary | ICD-10-CM | POA: Diagnosis not present

## 2022-11-17 DIAGNOSIS — H353130 Nonexudative age-related macular degeneration, bilateral, stage unspecified: Secondary | ICD-10-CM | POA: Diagnosis not present

## 2022-11-17 DIAGNOSIS — H35372 Puckering of macula, left eye: Secondary | ICD-10-CM | POA: Diagnosis not present

## 2022-11-17 DIAGNOSIS — H43813 Vitreous degeneration, bilateral: Secondary | ICD-10-CM | POA: Diagnosis not present

## 2023-01-06 ENCOUNTER — Encounter: Payer: Self-pay | Admitting: Podiatry

## 2023-01-06 ENCOUNTER — Ambulatory Visit: Payer: Medicare HMO | Admitting: Podiatry

## 2023-01-06 DIAGNOSIS — M79676 Pain in unspecified toe(s): Secondary | ICD-10-CM

## 2023-01-06 DIAGNOSIS — B351 Tinea unguium: Secondary | ICD-10-CM | POA: Diagnosis not present

## 2023-01-06 DIAGNOSIS — E119 Type 2 diabetes mellitus without complications: Secondary | ICD-10-CM

## 2023-01-06 NOTE — Progress Notes (Signed)
This patient returns to my office for at risk foot care.  This patient requires this care by a professional since this patient will be at risk due to having type 2 diabetes.    This patient is unable to cut nails herself since the patient cannot reach her nails.These nails are painful walking and wearing shoes.  This patient presents for at risk foot care today.    General Appearance  Alert, conversant and in no acute stress.  Vascular  Dorsalis pedis and posterior tibial  pulses are not  palpable  bilaterally.  Capillary return is within normal limits  bilaterally. Temperature is within normal limits  Bilaterally.  Absent hair.  Neurologic  Senn-Weinstein monofilament wire test within normal limits  bilaterally. Muscle power within normal limits bilaterally.  Nails Thick disfigured discolored nails with subungual debris  from hallux to fifth toes bilaterally. No evidence of bacterial infection or drainage bilaterally.  Orthopedic  No limitations of motion  feet .  No crepitus or effusions noted.  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   Skin  normotropic skin with no porokeratosis noted bilaterally.  No signs of infections or ulcers noted.     Onychomycosis  Pain in right toes  Pain in left toes  Toe sprain fifth toe right foot.  Consent was obtained for treatment procedures.   Mechanical debridement of nails 1-5  bilaterally performed with a nail nipper.  Filed with dremel without incident   Return office visit  3 months                   Told patient to return for periodic foot care and evaluation due to potential at risk complications.  Visual inspection of feet was performed.   Gregory Mayer DPM  

## 2023-01-07 DIAGNOSIS — Z0000 Encounter for general adult medical examination without abnormal findings: Secondary | ICD-10-CM | POA: Diagnosis not present

## 2023-01-07 DIAGNOSIS — H3530 Unspecified macular degeneration: Secondary | ICD-10-CM | POA: Diagnosis not present

## 2023-01-07 DIAGNOSIS — E1169 Type 2 diabetes mellitus with other specified complication: Secondary | ICD-10-CM | POA: Diagnosis not present

## 2023-01-07 DIAGNOSIS — I10 Essential (primary) hypertension: Secondary | ICD-10-CM | POA: Diagnosis not present

## 2023-01-07 DIAGNOSIS — E785 Hyperlipidemia, unspecified: Secondary | ICD-10-CM | POA: Diagnosis not present

## 2023-01-07 DIAGNOSIS — E119 Type 2 diabetes mellitus without complications: Secondary | ICD-10-CM | POA: Diagnosis not present

## 2023-02-08 DIAGNOSIS — H6123 Impacted cerumen, bilateral: Secondary | ICD-10-CM | POA: Diagnosis not present

## 2023-04-08 ENCOUNTER — Encounter: Payer: Self-pay | Admitting: Podiatry

## 2023-04-08 ENCOUNTER — Ambulatory Visit: Payer: Medicare HMO | Admitting: Podiatry

## 2023-04-08 DIAGNOSIS — M79676 Pain in unspecified toe(s): Secondary | ICD-10-CM

## 2023-04-08 DIAGNOSIS — E119 Type 2 diabetes mellitus without complications: Secondary | ICD-10-CM | POA: Diagnosis not present

## 2023-04-08 DIAGNOSIS — B351 Tinea unguium: Secondary | ICD-10-CM

## 2023-04-08 NOTE — Progress Notes (Signed)
This patient returns to my office for at risk foot care.  This patient requires this care by a professional since this patient will be at risk due to having type 2 diabetes.    This patient is unable to cut nails herself since the patient cannot reach her nails.These nails are painful walking and wearing shoes.  This patient presents for at risk foot care today.    General Appearance  Alert, conversant and in no acute stress.  Vascular  Dorsalis pedis and posterior tibial  pulses are not  palpable  bilaterally.  Capillary return is within normal limits  bilaterally. Temperature is within normal limits  Bilaterally.  Absent hair.  Neurologic  Senn-Weinstein monofilament wire test within normal limits  bilaterally. Muscle power within normal limits bilaterally.  Nails Thick disfigured discolored nails with subungual debris  from hallux to fifth toes bilaterally. No evidence of bacterial infection or drainage bilaterally.  Orthopedic  No limitations of motion  feet .  No crepitus or effusions noted.  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   Skin  normotropic skin with no porokeratosis noted bilaterally.  No signs of infections or ulcers noted.     Onychomycosis  Pain in right toes  Pain in left toes  Toe sprain fifth toe right foot.  Consent was obtained for treatment procedures.   Mechanical debridement of nails 1-5  bilaterally performed with a nail nipper.  Filed with dremel without incident   Return office visit  3 months                   Told patient to return for periodic foot care and evaluation due to potential at risk complications.  Visual inspection of feet was performed.   Felix Pratt DPM  

## 2023-04-16 ENCOUNTER — Other Ambulatory Visit: Payer: Self-pay | Admitting: Family Medicine

## 2023-04-16 DIAGNOSIS — Z1231 Encounter for screening mammogram for malignant neoplasm of breast: Secondary | ICD-10-CM

## 2023-05-13 DIAGNOSIS — C44319 Basal cell carcinoma of skin of other parts of face: Secondary | ICD-10-CM | POA: Diagnosis not present

## 2023-05-13 DIAGNOSIS — L570 Actinic keratosis: Secondary | ICD-10-CM | POA: Diagnosis not present

## 2023-05-13 DIAGNOSIS — L821 Other seborrheic keratosis: Secondary | ICD-10-CM | POA: Diagnosis not present

## 2023-05-13 DIAGNOSIS — D225 Melanocytic nevi of trunk: Secondary | ICD-10-CM | POA: Diagnosis not present

## 2023-05-13 DIAGNOSIS — D2262 Melanocytic nevi of left upper limb, including shoulder: Secondary | ICD-10-CM | POA: Diagnosis not present

## 2023-05-13 DIAGNOSIS — Z85828 Personal history of other malignant neoplasm of skin: Secondary | ICD-10-CM | POA: Diagnosis not present

## 2023-05-13 DIAGNOSIS — D224 Melanocytic nevi of scalp and neck: Secondary | ICD-10-CM | POA: Diagnosis not present

## 2023-05-13 DIAGNOSIS — D1801 Hemangioma of skin and subcutaneous tissue: Secondary | ICD-10-CM | POA: Diagnosis not present

## 2023-05-13 DIAGNOSIS — C4401 Basal cell carcinoma of skin of lip: Secondary | ICD-10-CM | POA: Diagnosis not present

## 2023-05-21 ENCOUNTER — Ambulatory Visit
Admission: RE | Admit: 2023-05-21 | Discharge: 2023-05-21 | Disposition: A | Discharge status: Home / Self Care | Payer: Medicare HMO | Source: Ambulatory Visit | Attending: Family Medicine | Admitting: Family Medicine

## 2023-05-21 DIAGNOSIS — Z1231 Encounter for screening mammogram for malignant neoplasm of breast: Secondary | ICD-10-CM

## 2023-06-21 DIAGNOSIS — C4401 Basal cell carcinoma of skin of lip: Secondary | ICD-10-CM | POA: Diagnosis not present

## 2023-07-06 DIAGNOSIS — H353134 Nonexudative age-related macular degeneration, bilateral, advanced atrophic with subfoveal involvement: Secondary | ICD-10-CM | POA: Diagnosis not present

## 2023-07-06 DIAGNOSIS — H04123 Dry eye syndrome of bilateral lacrimal glands: Secondary | ICD-10-CM | POA: Diagnosis not present

## 2023-07-06 DIAGNOSIS — H35372 Puckering of macula, left eye: Secondary | ICD-10-CM | POA: Diagnosis not present

## 2023-07-06 DIAGNOSIS — H43813 Vitreous degeneration, bilateral: Secondary | ICD-10-CM | POA: Diagnosis not present

## 2023-07-06 DIAGNOSIS — E113293 Type 2 diabetes mellitus with mild nonproliferative diabetic retinopathy without macular edema, bilateral: Secondary | ICD-10-CM | POA: Diagnosis not present

## 2023-07-07 ENCOUNTER — Ambulatory Visit: Payer: Medicare HMO | Admitting: Podiatry

## 2023-07-08 ENCOUNTER — Ambulatory Visit: Payer: Medicare HMO | Admitting: Podiatry

## 2023-07-08 ENCOUNTER — Encounter: Payer: Self-pay | Admitting: Podiatry

## 2023-07-08 VITALS — Ht <= 58 in | Wt 148.0 lb

## 2023-07-08 DIAGNOSIS — E119 Type 2 diabetes mellitus without complications: Secondary | ICD-10-CM | POA: Diagnosis not present

## 2023-07-08 DIAGNOSIS — B351 Tinea unguium: Secondary | ICD-10-CM | POA: Diagnosis not present

## 2023-07-08 DIAGNOSIS — M79676 Pain in unspecified toe(s): Secondary | ICD-10-CM

## 2023-07-08 NOTE — Progress Notes (Signed)
This patient returns to my office for at risk foot care.  This patient requires this care by a professional since this patient will be at risk due to having type 2 diabetes.    This patient is unable to cut nails herself since the patient cannot reach her nails.These nails are painful walking and wearing shoes.  This patient presents for at risk foot care today.    General Appearance  Alert, conversant and in no acute stress.  Vascular  Dorsalis pedis and posterior tibial  pulses are not  palpable  bilaterally.  Capillary return is within normal limits  bilaterally. Temperature is within normal limits  Bilaterally.  Absent hair.  Neurologic  Senn-Weinstein monofilament wire test within normal limits  bilaterally. Muscle power within normal limits bilaterally.  Nails Thick disfigured discolored nails with subungual debris  from hallux to fifth toes bilaterally. No evidence of bacterial infection or drainage bilaterally.  Orthopedic  No limitations of motion  feet .  No crepitus or effusions noted.  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   Skin  normotropic skin with no porokeratosis noted bilaterally.  No signs of infections or ulcers noted.     Onychomycosis  Pain in right toes  Pain in left toes  Toe sprain fifth toe right foot.  Consent was obtained for treatment procedures.   Mechanical debridement of nails 1-5  bilaterally performed with a nail nipper.  Filed with dremel without incident   Return office visit  3 months                   Told patient to return for periodic foot care and evaluation due to potential at risk complications.  Visual inspection of feet was performed.   Felix Pratt DPM  

## 2023-08-19 DIAGNOSIS — H6122 Impacted cerumen, left ear: Secondary | ICD-10-CM | POA: Diagnosis not present

## 2023-09-01 DIAGNOSIS — H353134 Nonexudative age-related macular degeneration, bilateral, advanced atrophic with subfoveal involvement: Secondary | ICD-10-CM | POA: Diagnosis not present

## 2023-09-01 DIAGNOSIS — E113293 Type 2 diabetes mellitus with mild nonproliferative diabetic retinopathy without macular edema, bilateral: Secondary | ICD-10-CM | POA: Diagnosis not present

## 2023-09-01 DIAGNOSIS — H35372 Puckering of macula, left eye: Secondary | ICD-10-CM | POA: Diagnosis not present

## 2023-09-01 DIAGNOSIS — H43813 Vitreous degeneration, bilateral: Secondary | ICD-10-CM | POA: Diagnosis not present

## 2023-09-01 DIAGNOSIS — H04123 Dry eye syndrome of bilateral lacrimal glands: Secondary | ICD-10-CM | POA: Diagnosis not present

## 2023-09-21 DIAGNOSIS — Z961 Presence of intraocular lens: Secondary | ICD-10-CM | POA: Diagnosis not present

## 2023-09-21 DIAGNOSIS — H04123 Dry eye syndrome of bilateral lacrimal glands: Secondary | ICD-10-CM | POA: Diagnosis not present

## 2023-09-21 DIAGNOSIS — H0102B Squamous blepharitis left eye, upper and lower eyelids: Secondary | ICD-10-CM | POA: Diagnosis not present

## 2023-09-21 DIAGNOSIS — E113293 Type 2 diabetes mellitus with mild nonproliferative diabetic retinopathy without macular edema, bilateral: Secondary | ICD-10-CM | POA: Diagnosis not present

## 2023-09-21 DIAGNOSIS — H43811 Vitreous degeneration, right eye: Secondary | ICD-10-CM | POA: Diagnosis not present

## 2023-09-21 DIAGNOSIS — H353134 Nonexudative age-related macular degeneration, bilateral, advanced atrophic with subfoveal involvement: Secondary | ICD-10-CM | POA: Diagnosis not present

## 2023-09-21 DIAGNOSIS — H0102A Squamous blepharitis right eye, upper and lower eyelids: Secondary | ICD-10-CM | POA: Diagnosis not present

## 2023-10-08 ENCOUNTER — Ambulatory Visit: Admitting: Podiatry

## 2023-10-08 ENCOUNTER — Encounter: Payer: Self-pay | Admitting: Podiatry

## 2023-10-08 DIAGNOSIS — M79676 Pain in unspecified toe(s): Secondary | ICD-10-CM | POA: Diagnosis not present

## 2023-10-08 DIAGNOSIS — E119 Type 2 diabetes mellitus without complications: Secondary | ICD-10-CM

## 2023-10-08 DIAGNOSIS — B351 Tinea unguium: Secondary | ICD-10-CM | POA: Diagnosis not present

## 2023-10-08 NOTE — Progress Notes (Signed)
This patient returns to my office for at risk foot care.  This patient requires this care by a professional since this patient will be at risk due to having type 2 diabetes.    This patient is unable to cut nails herself since the patient cannot reach her nails.These nails are painful walking and wearing shoes.  This patient presents for at risk foot care today.    General Appearance  Alert, conversant and in no acute stress.  Vascular  Dorsalis pedis and posterior tibial  pulses are not  palpable  bilaterally.  Capillary return is within normal limits  bilaterally. Temperature is within normal limits  Bilaterally.  Absent hair.  Neurologic  Senn-Weinstein monofilament wire test within normal limits  bilaterally. Muscle power within normal limits bilaterally.  Nails Thick disfigured discolored nails with subungual debris  from hallux to fifth toes bilaterally. No evidence of bacterial infection or drainage bilaterally.  Orthopedic  No limitations of motion  feet .  No crepitus or effusions noted.  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   Skin  normotropic skin with no porokeratosis noted bilaterally.  No signs of infections or ulcers noted.     Onychomycosis  Pain in right toes  Pain in left toes  Toe sprain fifth toe right foot.  Consent was obtained for treatment procedures.   Mechanical debridement of nails 1-5  bilaterally performed with a nail nipper.  Filed with dremel without incident   Return office visit  3 months                   Told patient to return for periodic foot care and evaluation due to potential at risk complications.  Visual inspection of feet was performed.   Felix Pratt DPM  

## 2023-10-15 DIAGNOSIS — E1169 Type 2 diabetes mellitus with other specified complication: Secondary | ICD-10-CM | POA: Diagnosis not present

## 2023-10-15 DIAGNOSIS — E785 Hyperlipidemia, unspecified: Secondary | ICD-10-CM | POA: Diagnosis not present

## 2023-10-15 DIAGNOSIS — I10 Essential (primary) hypertension: Secondary | ICD-10-CM | POA: Diagnosis not present

## 2023-10-26 DIAGNOSIS — H35372 Puckering of macula, left eye: Secondary | ICD-10-CM | POA: Diagnosis not present

## 2023-10-26 DIAGNOSIS — H43813 Vitreous degeneration, bilateral: Secondary | ICD-10-CM | POA: Diagnosis not present

## 2023-10-26 DIAGNOSIS — H04123 Dry eye syndrome of bilateral lacrimal glands: Secondary | ICD-10-CM | POA: Diagnosis not present

## 2023-10-26 DIAGNOSIS — E113293 Type 2 diabetes mellitus with mild nonproliferative diabetic retinopathy without macular edema, bilateral: Secondary | ICD-10-CM | POA: Diagnosis not present

## 2023-10-26 DIAGNOSIS — H353134 Nonexudative age-related macular degeneration, bilateral, advanced atrophic with subfoveal involvement: Secondary | ICD-10-CM | POA: Diagnosis not present

## 2024-01-10 ENCOUNTER — Ambulatory Visit: Admitting: Podiatry

## 2024-01-10 ENCOUNTER — Encounter: Payer: Self-pay | Admitting: Podiatry

## 2024-01-10 DIAGNOSIS — E119 Type 2 diabetes mellitus without complications: Secondary | ICD-10-CM

## 2024-01-10 DIAGNOSIS — M79676 Pain in unspecified toe(s): Secondary | ICD-10-CM

## 2024-01-10 DIAGNOSIS — B351 Tinea unguium: Secondary | ICD-10-CM | POA: Diagnosis not present

## 2024-01-10 NOTE — Progress Notes (Signed)
This patient returns to my office for at risk foot care.  This patient requires this care by a professional since this patient will be at risk due to having type 2 diabetes.    This patient is unable to cut nails herself since the patient cannot reach her nails.These nails are painful walking and wearing shoes.  This patient presents for at risk foot care today.    General Appearance  Alert, conversant and in no acute stress.  Vascular  Dorsalis pedis and posterior tibial  pulses are not  palpable  bilaterally.  Capillary return is within normal limits  bilaterally. Temperature is within normal limits  Bilaterally.  Absent hair.  Neurologic  Senn-Weinstein monofilament wire test within normal limits  bilaterally. Muscle power within normal limits bilaterally.  Nails Thick disfigured discolored nails with subungual debris  from hallux to fifth toes bilaterally. No evidence of bacterial infection or drainage bilaterally.  Orthopedic  No limitations of motion  feet .  No crepitus or effusions noted.  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   Skin  normotropic skin with no porokeratosis noted bilaterally.  No signs of infections or ulcers noted.     Onychomycosis  Pain in right toes  Pain in left toes  Toe sprain fifth toe right foot.  Consent was obtained for treatment procedures.   Mechanical debridement of nails 1-5  bilaterally performed with a nail nipper.  Filed with dremel without incident   Return office visit  3 months                   Told patient to return for periodic foot care and evaluation due to potential at risk complications.  Visual inspection of feet was performed.   Felix Pratt DPM  

## 2024-02-01 DIAGNOSIS — Z23 Encounter for immunization: Secondary | ICD-10-CM | POA: Diagnosis not present

## 2024-02-01 DIAGNOSIS — Z1211 Encounter for screening for malignant neoplasm of colon: Secondary | ICD-10-CM | POA: Diagnosis not present

## 2024-02-01 DIAGNOSIS — I10 Essential (primary) hypertension: Secondary | ICD-10-CM | POA: Diagnosis not present

## 2024-02-01 DIAGNOSIS — H3530 Unspecified macular degeneration: Secondary | ICD-10-CM | POA: Diagnosis not present

## 2024-02-01 DIAGNOSIS — E1169 Type 2 diabetes mellitus with other specified complication: Secondary | ICD-10-CM | POA: Diagnosis not present

## 2024-02-01 DIAGNOSIS — Z0000 Encounter for general adult medical examination without abnormal findings: Secondary | ICD-10-CM | POA: Diagnosis not present

## 2024-02-01 DIAGNOSIS — E785 Hyperlipidemia, unspecified: Secondary | ICD-10-CM | POA: Diagnosis not present

## 2024-02-16 DIAGNOSIS — Z1211 Encounter for screening for malignant neoplasm of colon: Secondary | ICD-10-CM | POA: Diagnosis not present

## 2024-02-16 DIAGNOSIS — Z1212 Encounter for screening for malignant neoplasm of rectum: Secondary | ICD-10-CM | POA: Diagnosis not present

## 2024-04-10 ENCOUNTER — Ambulatory Visit: Admitting: Podiatry

## 2024-04-10 ENCOUNTER — Encounter: Payer: Self-pay | Admitting: Podiatry

## 2024-04-10 DIAGNOSIS — M79676 Pain in unspecified toe(s): Secondary | ICD-10-CM

## 2024-04-10 DIAGNOSIS — E119 Type 2 diabetes mellitus without complications: Secondary | ICD-10-CM | POA: Diagnosis not present

## 2024-04-10 DIAGNOSIS — B351 Tinea unguium: Secondary | ICD-10-CM

## 2024-04-10 NOTE — Progress Notes (Signed)
This patient returns to my office for at risk foot care.  This patient requires this care by a professional since this patient will be at risk due to having type 2 diabetes.    This patient is unable to cut nails herself since the patient cannot reach her nails.These nails are painful walking and wearing shoes.  This patient presents for at risk foot care today.    General Appearance  Alert, conversant and in no acute stress.  Vascular  Dorsalis pedis and posterior tibial  pulses are not  palpable  bilaterally.  Capillary return is within normal limits  bilaterally. Temperature is within normal limits  Bilaterally.  Absent hair.  Neurologic  Senn-Weinstein monofilament wire test within normal limits  bilaterally. Muscle power within normal limits bilaterally.  Nails Thick disfigured discolored nails with subungual debris  from hallux to fifth toes bilaterally. No evidence of bacterial infection or drainage bilaterally.  Orthopedic  No limitations of motion  feet .  No crepitus or effusions noted.  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   Skin  normotropic skin with no porokeratosis noted bilaterally.  No signs of infections or ulcers noted.     Onychomycosis  Pain in right toes  Pain in left toes  Toe sprain fifth toe right foot.  Consent was obtained for treatment procedures.   Mechanical debridement of nails 1-5  bilaterally performed with a nail nipper.  Filed with dremel without incident   Return office visit  3 months                   Told patient to return for periodic foot care and evaluation due to potential at risk complications.  Visual inspection of feet was performed.   Felix Pratt DPM  

## 2024-04-14 ENCOUNTER — Other Ambulatory Visit: Payer: Self-pay | Admitting: Family Medicine

## 2024-04-14 DIAGNOSIS — Z1231 Encounter for screening mammogram for malignant neoplasm of breast: Secondary | ICD-10-CM

## 2024-04-28 DIAGNOSIS — H353134 Nonexudative age-related macular degeneration, bilateral, advanced atrophic with subfoveal involvement: Secondary | ICD-10-CM | POA: Diagnosis not present

## 2024-04-28 DIAGNOSIS — E113293 Type 2 diabetes mellitus with mild nonproliferative diabetic retinopathy without macular edema, bilateral: Secondary | ICD-10-CM | POA: Diagnosis not present

## 2024-04-28 DIAGNOSIS — H04123 Dry eye syndrome of bilateral lacrimal glands: Secondary | ICD-10-CM | POA: Diagnosis not present

## 2024-04-28 DIAGNOSIS — H43813 Vitreous degeneration, bilateral: Secondary | ICD-10-CM | POA: Diagnosis not present

## 2024-04-28 DIAGNOSIS — H35372 Puckering of macula, left eye: Secondary | ICD-10-CM | POA: Diagnosis not present

## 2024-05-23 ENCOUNTER — Ambulatory Visit
Admission: RE | Admit: 2024-05-23 | Discharge: 2024-05-23 | Disposition: A | Source: Ambulatory Visit | Attending: Family Medicine | Admitting: Family Medicine

## 2024-05-23 DIAGNOSIS — Z1231 Encounter for screening mammogram for malignant neoplasm of breast: Secondary | ICD-10-CM

## 2024-07-14 ENCOUNTER — Ambulatory Visit: Admitting: Podiatry
